# Patient Record
Sex: Male | Born: 1985 | Race: Black or African American | Hispanic: No | Marital: Single | Smoking: Current every day smoker
Health system: Southern US, Community
[De-identification: ages and names within clinical notes are randomized; demographics above are authoritative.]

## PROBLEM LIST (undated history)

## (undated) DIAGNOSIS — J939 Pneumothorax, unspecified: Secondary | ICD-10-CM

## (undated) HISTORY — PX: VIDEO ASSISTED THORACOSCOPY (VATS)/THOROCOTOMY: SHX6173

---
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
## 2008-01-14 ENCOUNTER — Emergency Department (HOSPITAL_COMMUNITY): Admission: EM | Admit: 2008-01-14 | Discharge: 2008-01-14 | Payer: Self-pay | Admitting: Emergency Medicine

## 2008-03-19 ENCOUNTER — Emergency Department (HOSPITAL_COMMUNITY): Admission: EM | Admit: 2008-03-19 | Discharge: 2008-03-19 | Payer: Self-pay | Admitting: Emergency Medicine

## 2008-12-12 ENCOUNTER — Inpatient Hospital Stay (HOSPITAL_COMMUNITY): Admission: EM | Admit: 2008-12-12 | Discharge: 2008-12-18 | Payer: Self-pay | Admitting: Thoracic Surgery

## 2008-12-12 ENCOUNTER — Ambulatory Visit: Payer: Self-pay | Admitting: Thoracic Surgery

## 2008-12-15 ENCOUNTER — Encounter: Payer: Self-pay | Admitting: Thoracic Surgery

## 2008-12-22 ENCOUNTER — Encounter: Admission: RE | Admit: 2008-12-22 | Discharge: 2008-12-22 | Payer: Self-pay | Admitting: Thoracic Surgery

## 2008-12-22 ENCOUNTER — Ambulatory Visit: Payer: Self-pay | Admitting: Thoracic Surgery

## 2009-01-11 ENCOUNTER — Ambulatory Visit: Payer: Self-pay | Admitting: Thoracic Surgery

## 2010-07-14 IMAGING — CR DG CHEST 1V PORT
1 series · 1 of 1 positions shown · non-contrast
Comparison: Earlier the same day

CLINICAL DATA: Pneumothorax.  Chest tube placement.

PORTABLE CHEST - 1 VIEW

[view not recorded]
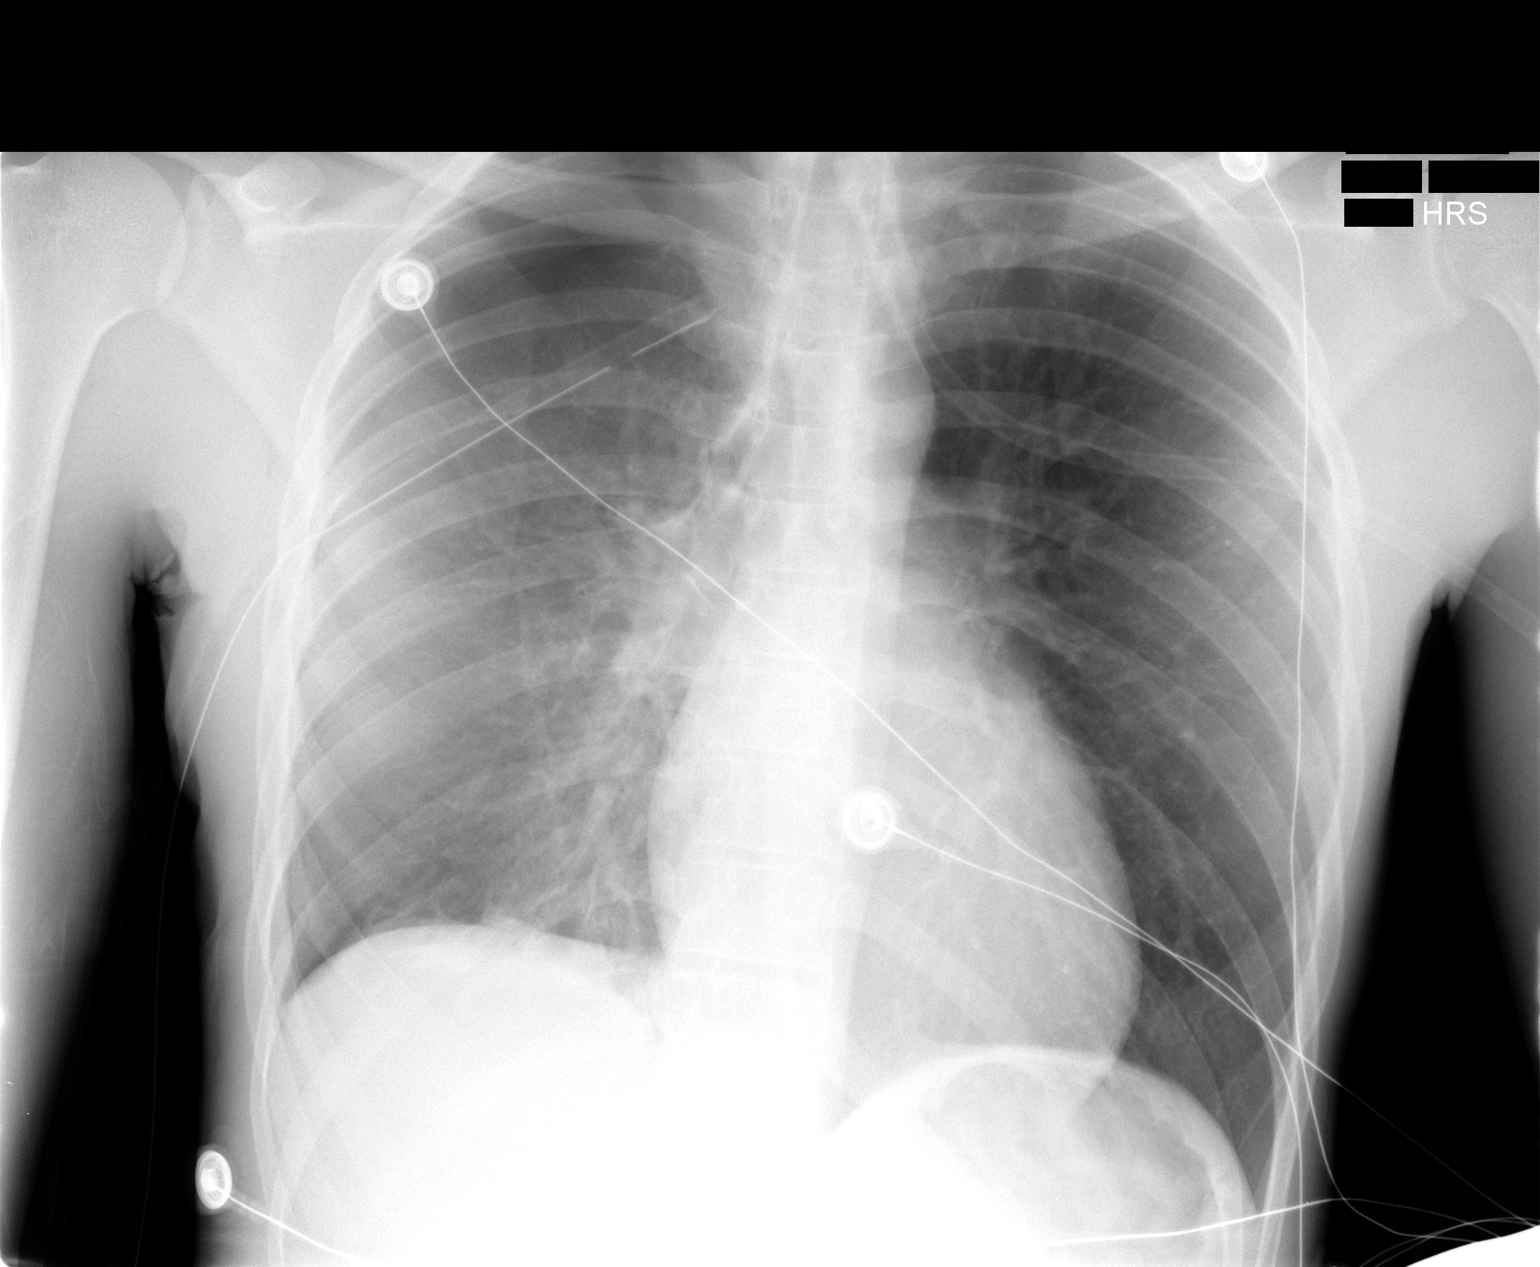

[1 of 1 positions shown; findings below may reference images not displayed]

FINDINGS: 7780 hours.  The patient is status post right chest tube
placement.  There has been interval improvement and right lung
expansion although apical right pneumothorax does persist.  Left
lung remains clear.  Heart size is within normal limits.
IMPRESSION: Interval placement of right chest tube with a substantial decrease
in the right pneumothorax.

## 2010-07-15 IMAGING — CT CT CHEST W/O CM
1 series · 15 of 31 positions shown, 19 images · non-contrast
Comparison: 12/13/2008

CLINICAL DATA: Right-sided pneumothorax.  Evaluate for bleb.

CT CHEST WITHOUT CONTRAST
TECHNIQUE: Multidetector CT imaging of the chest was performed
following the standard protocol without IV contrast.

[Series 2: routine chest 5.0 st · axial · 0.65mm/px · z∈[-196,+108]mm · 15 of 67 slices shown, 19 images]
[im 3/67  mediastinal]
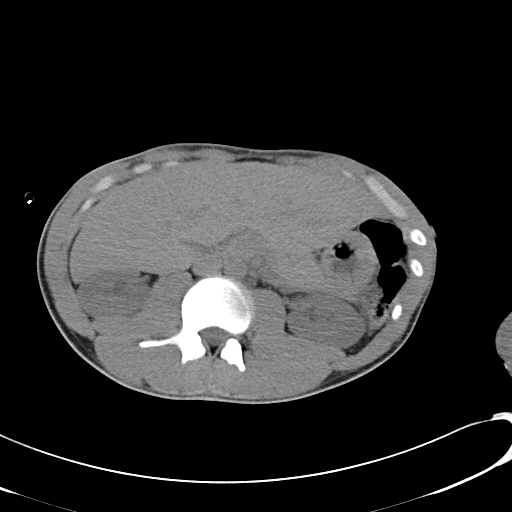
[im 3/67  lung]
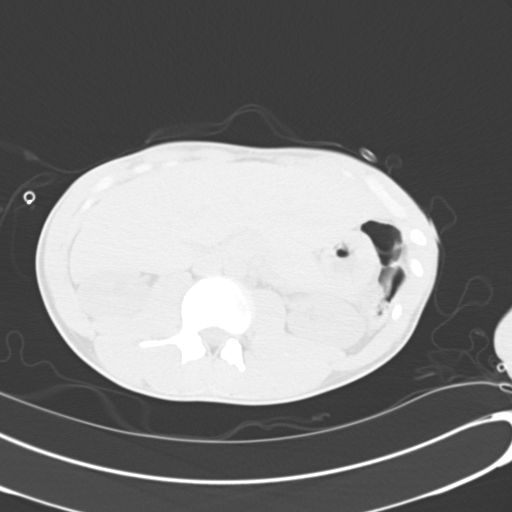
[im 8/67  lung]
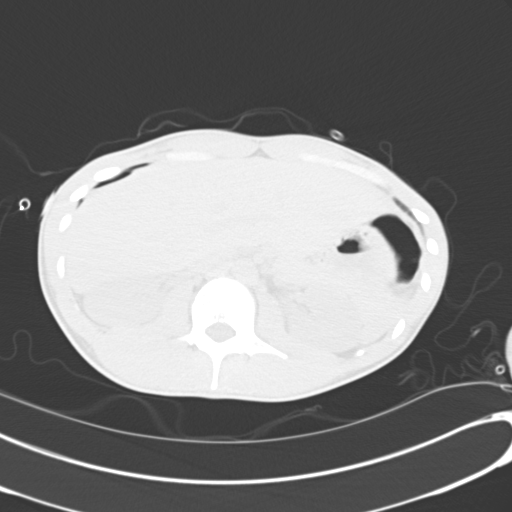
[im 13/67  lung]
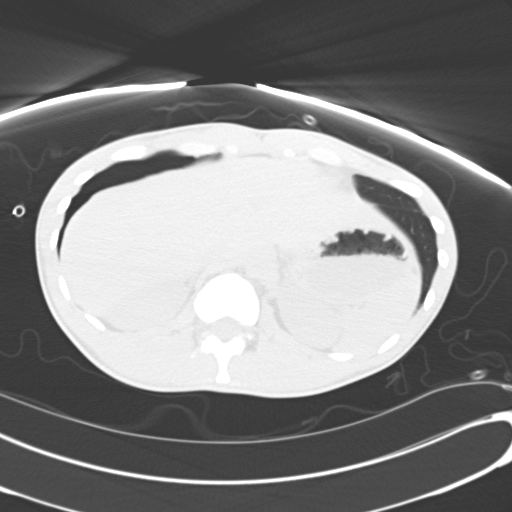
[im 15/67  lung]
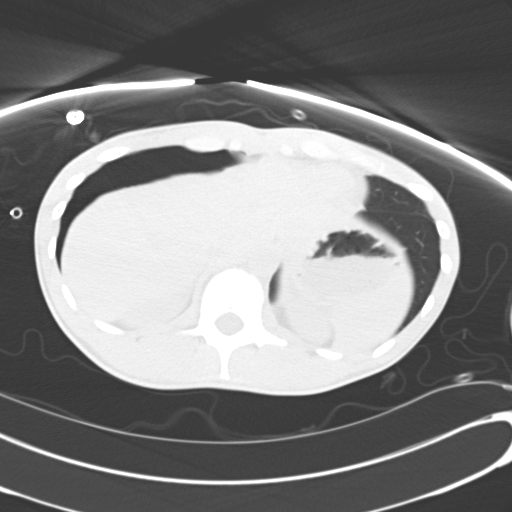
[im 20/67  mediastinal]
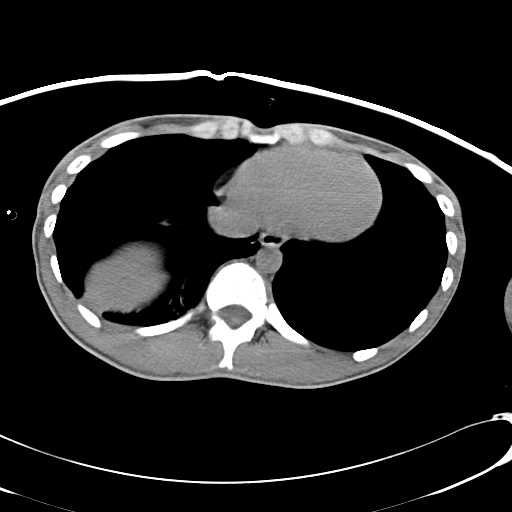
[im 20/67  lung]
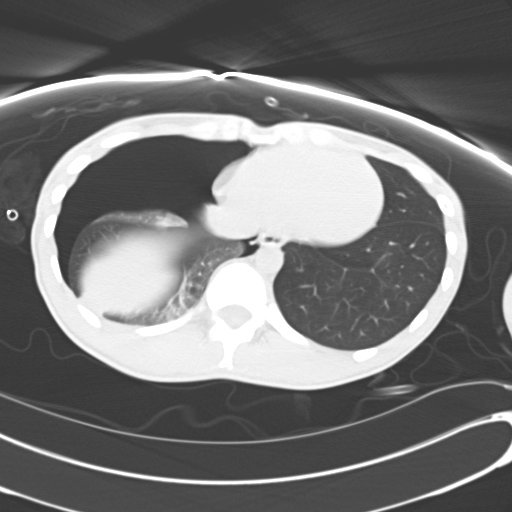
[im 25/67  lung]
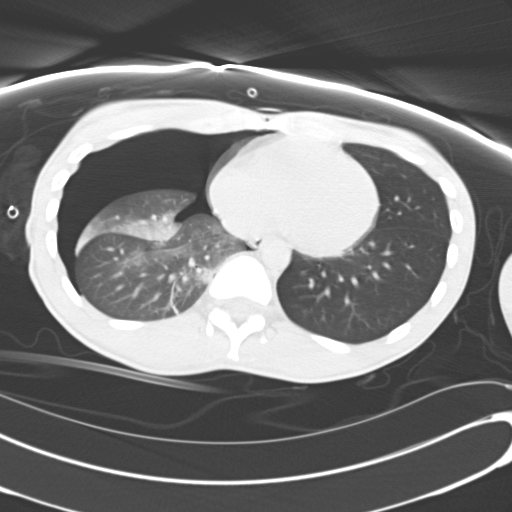
[im 30/67  lung]
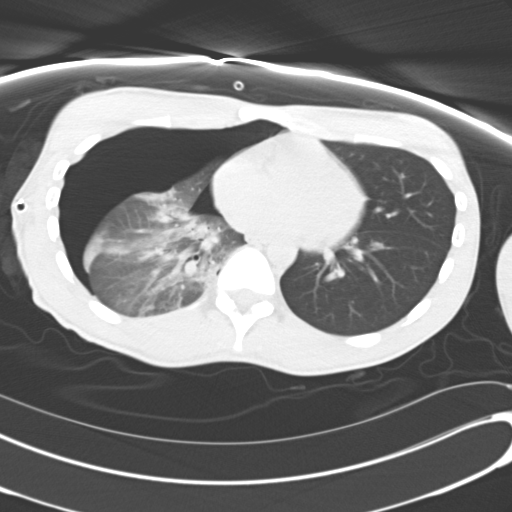
[im 35/67  lung]
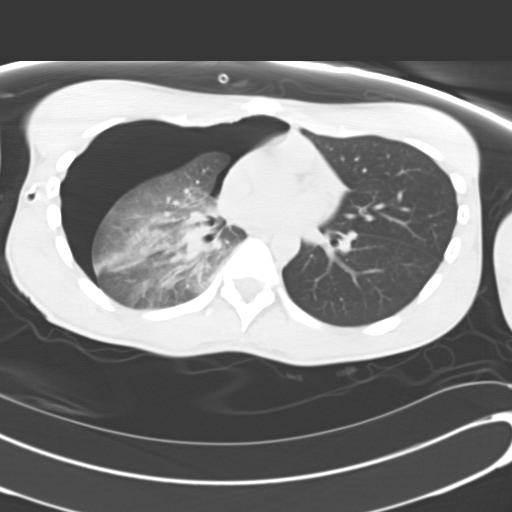
[im 37/67  mediastinal]
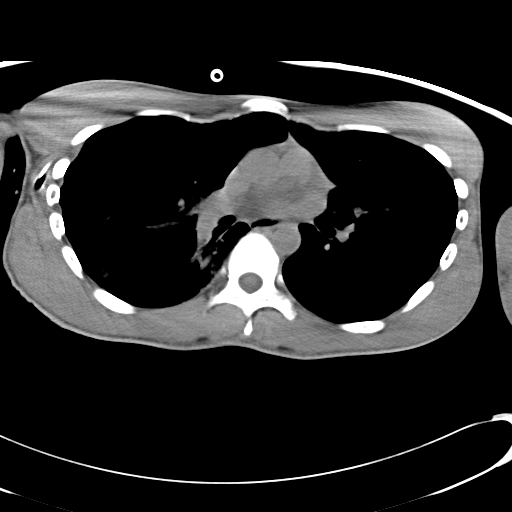
[im 37/67  lung]
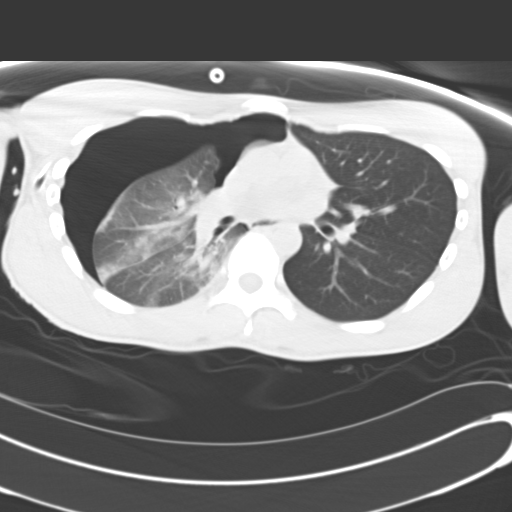
[im 42/67  lung]
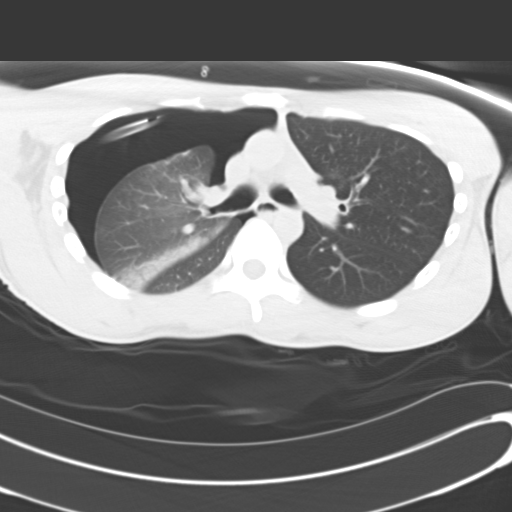
[im 47/67  lung]
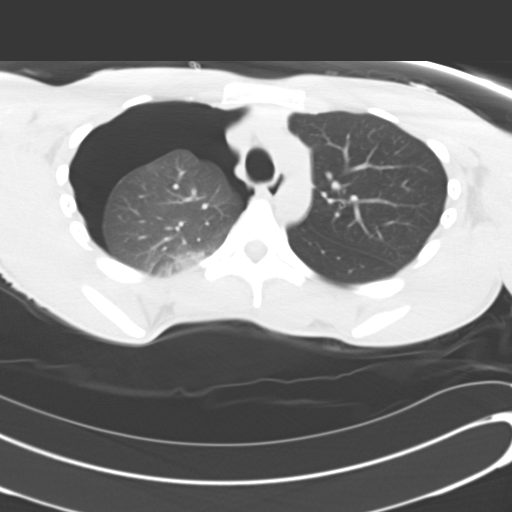
[im 52/67  lung]
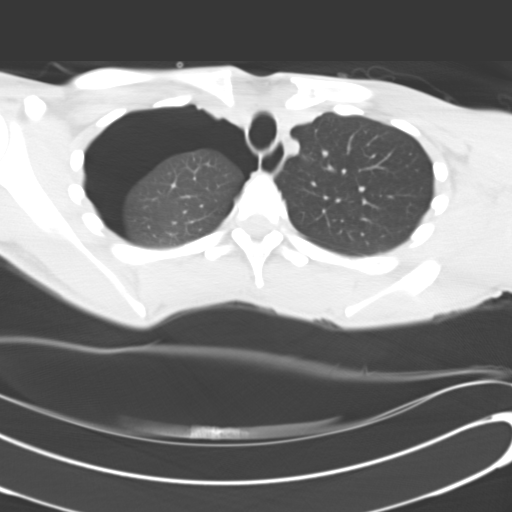
[im 54/67  mediastinal]
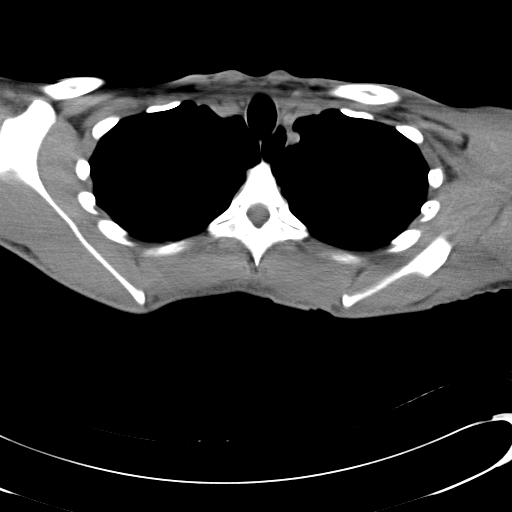
[im 54/67  lung]
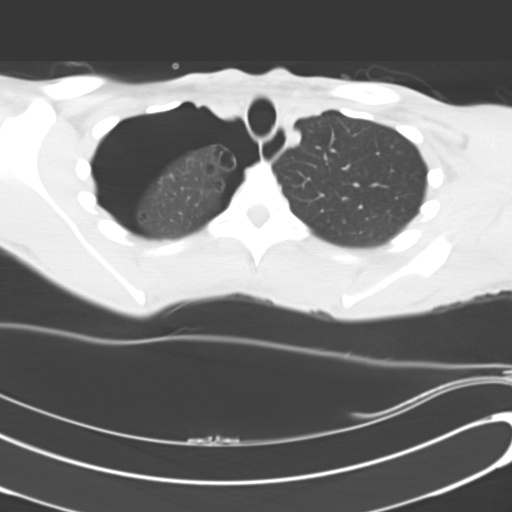
[im 59/67  lung]
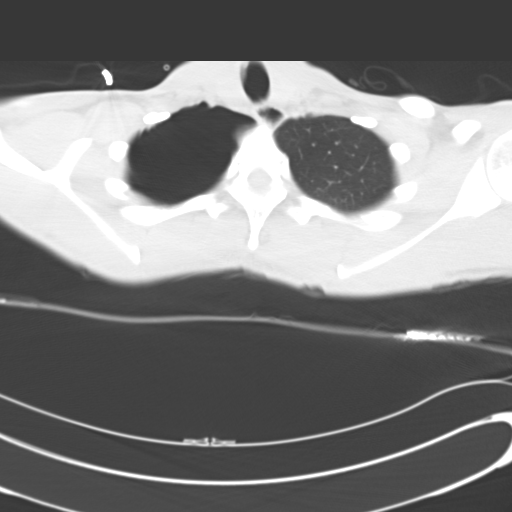
[im 64/67  lung]
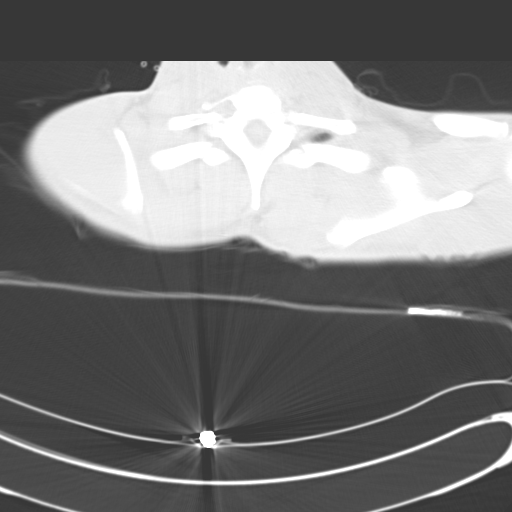

[15 of 31 positions shown; findings below may reference images not displayed]

FINDINGS: Lung windows demonstrate areas of pulmonary parenchymal
blebs at the apex.  This could also represent apical paraseptal
emphysema however the appearance is more consistent with leads,
with disorganized pulmonary parenchymal architecture.  These are
best seen on axial image number 13 and 14 in the medial right apex.
Smaller one is present in the lateral right apical posterior
segment.  The right-sided pneumothorax is large.  Atelectasis is
present in the partially collapsed lung.  Pneumothorax is estimated
at 40-50%.  The left lung appears within normal limits without
emphysema or blebs.  Trachea appears within normal limits.  Grossly
the heart is unremarkable, allowing for noncontrast technique.  No
lymphadenopathy is identified.  The right thoracostomy tube
terminates in the anterior right pleural space.  Incidental imaging
the upper abdomen is unremarkable.

There is also a small bleb in the lateral right middle lobe on
image number 35.  The upper thoracic esophagus is patulous.  Bones
appear within normal limits.
IMPRESSION: 1.  Large right pneumothorax with thoracostomy tube in place.
Multiple medial and lateral right upper lobe apical blebs.
2.  Small right middle lobe posterolateral bleb.
3.  No left-sided blebs or paraseptal emphysema identified.

## 2010-07-17 IMAGING — CR DG CHEST 1V PORT
1 series · 1 of 1 positions shown · non-contrast
Comparison: 12/14/2008

CLINICAL DATA: Right pneumothorax, chest tube, follow-up

PORTABLE CHEST - 1 VIEW

[view not recorded]
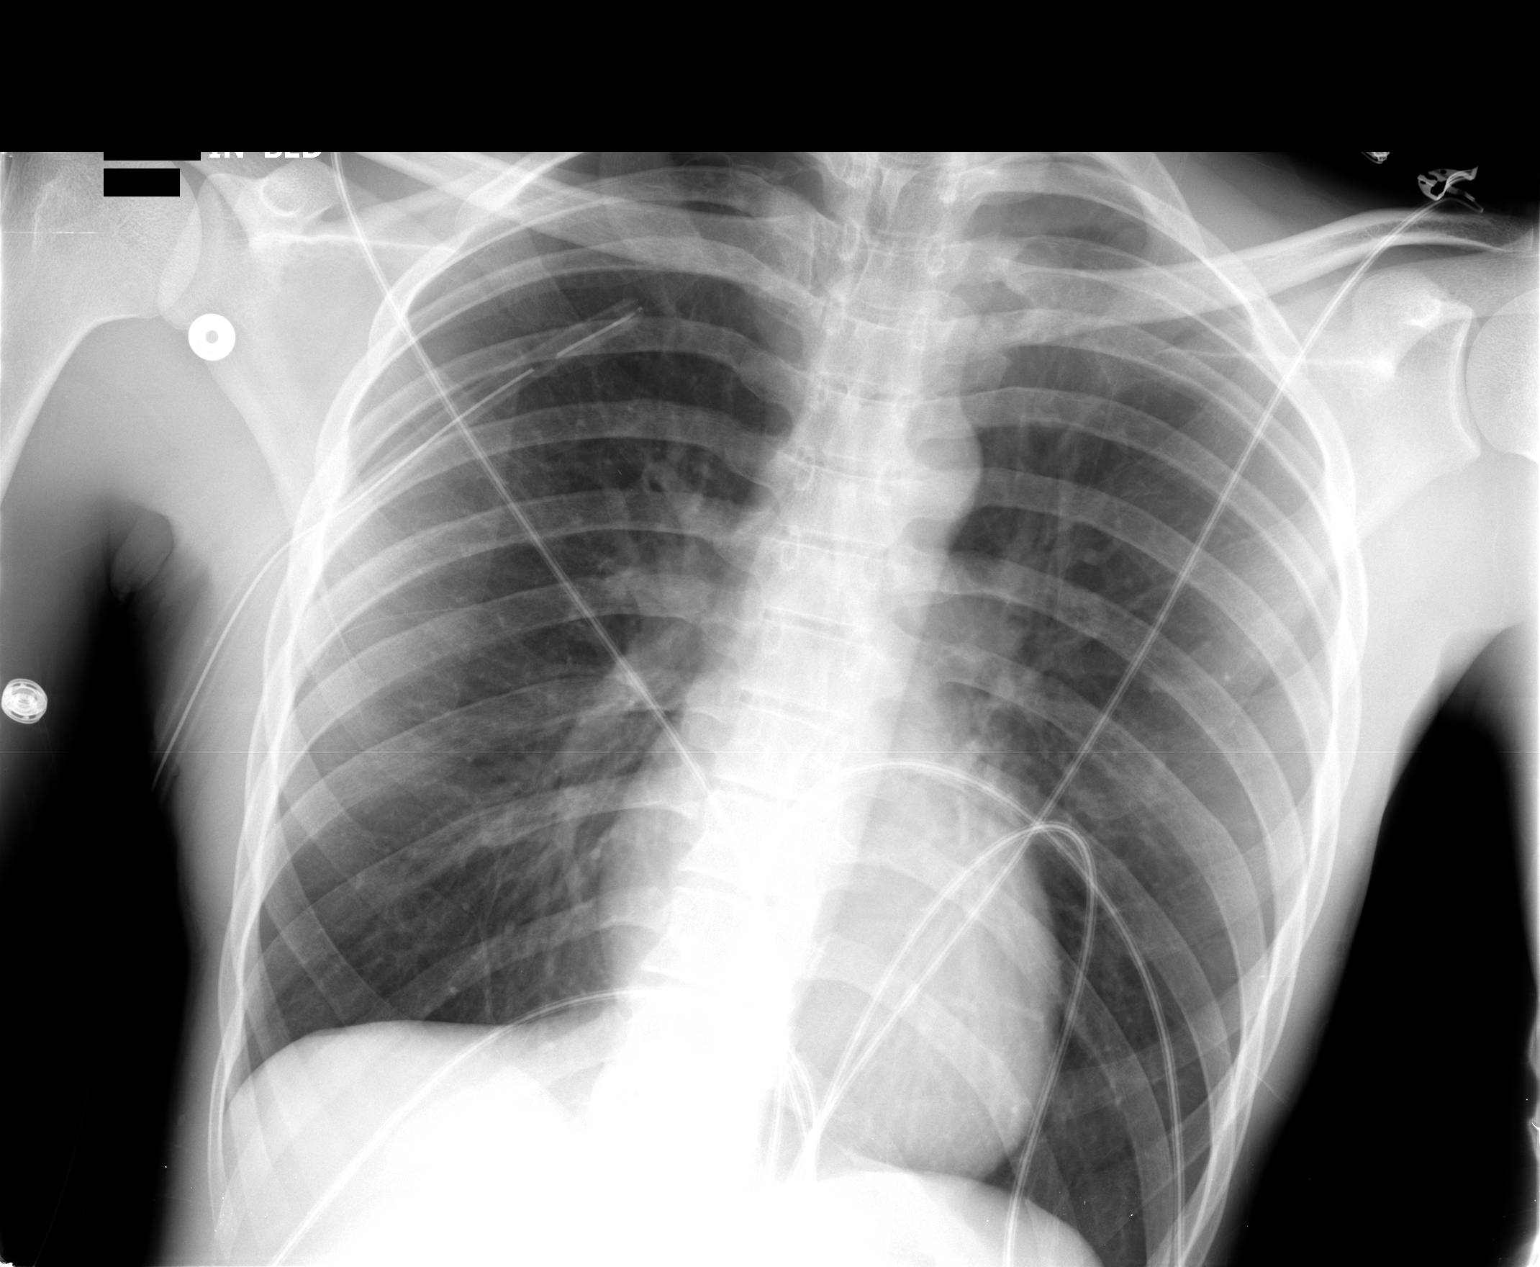

[1 of 1 positions shown; findings below may reference images not displayed]

FINDINGS: Stable small residual right apical pneumothorax.  Right
chest tube remains.  Lungs are otherwise clear.  Normal heart size
and vascularity.  Trachea is midline.
IMPRESSION: Stable right apical pneumothorax

## 2010-07-17 IMAGING — CR DG CHEST 1V PORT
1 series · 1 of 1 positions shown · non-contrast
Comparison: 12/15/2008 at  1461 hours

CLINICAL DATA: Central line placement and new chest tube placement.

PORTABLE CHEST - 1 VIEW

[view not recorded]
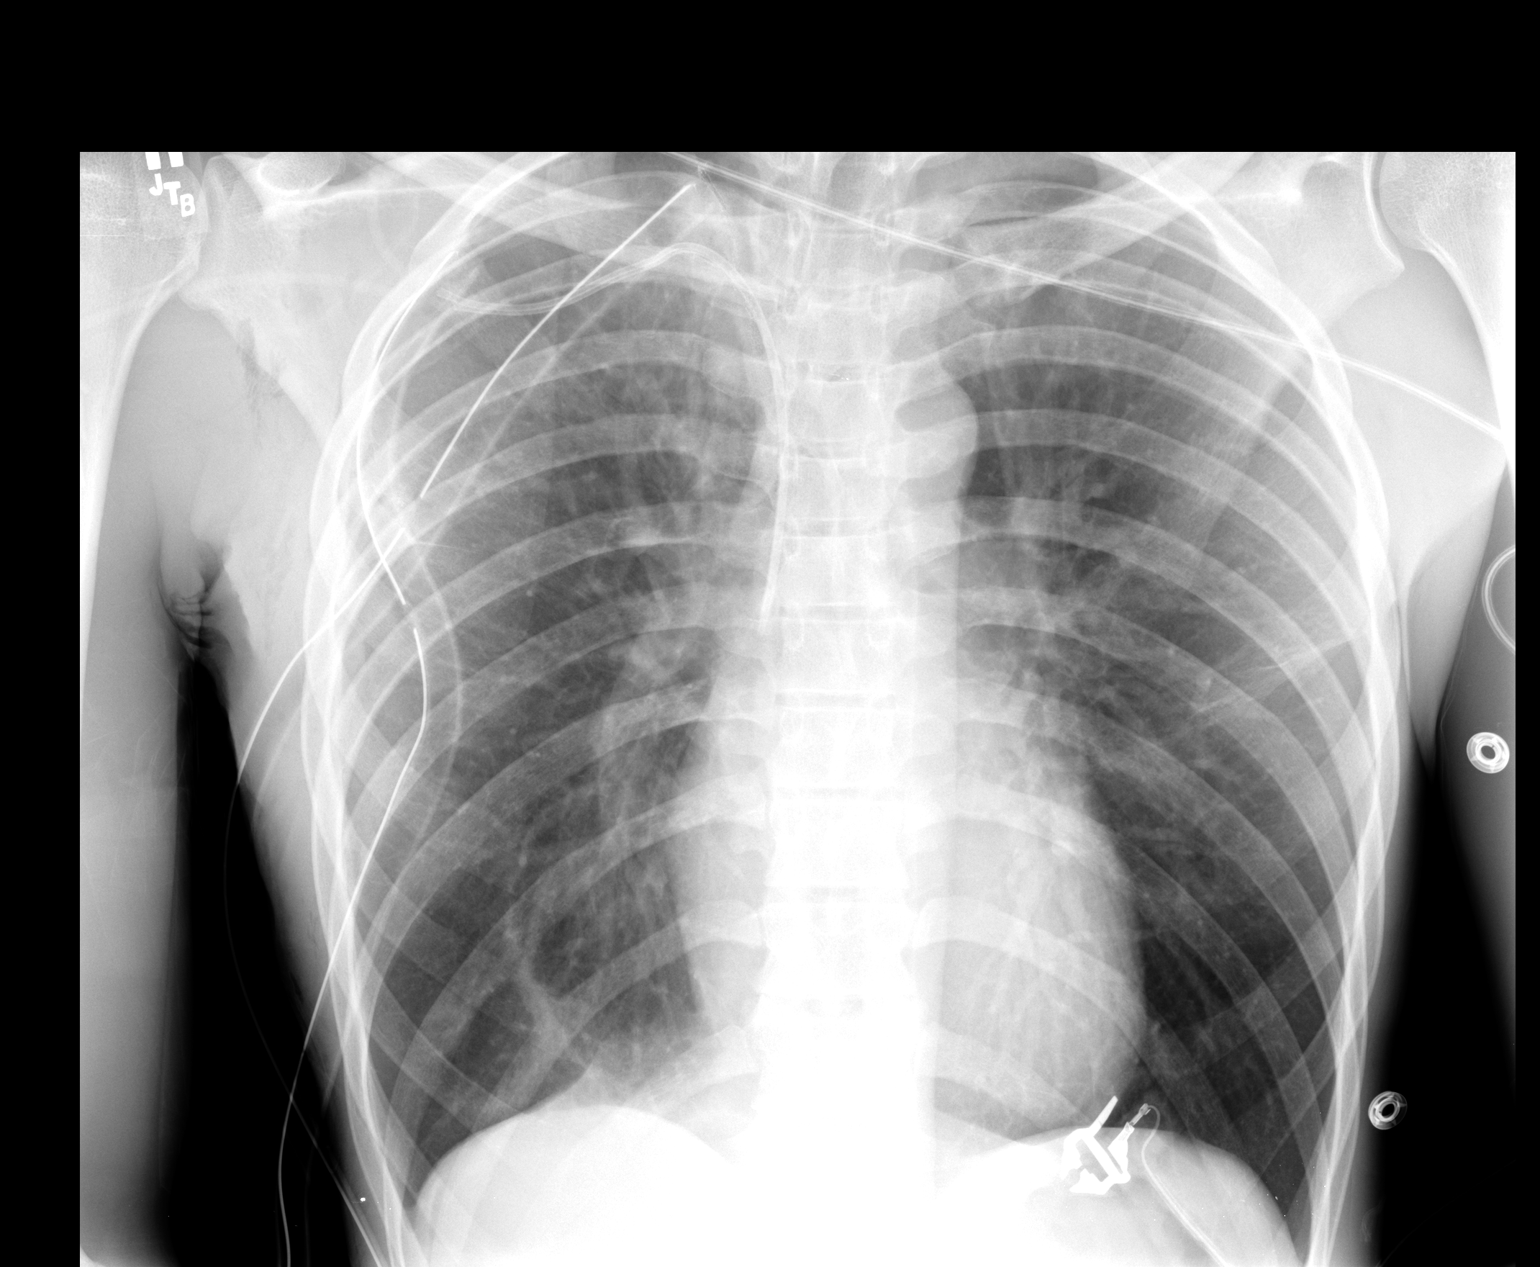

[1 of 1 positions shown; findings below may reference images not displayed]

FINDINGS: A new central line has been placed from the right
subclavian approach.  The tip is in the SVC, above the cavoatrial
junction.  A new right chest tube has been placed.  Right apical
pneumothorax is smaller, estimated at about 5%.  Lungs clear.
Heart and mediastinal contours normal.
IMPRESSION: 1.  New right subclavian central line placement to the SVC.
2.  A new right chest tube has been placed.
3.  Right apical pneumothorax decreased to about 5%.

## 2010-09-18 ENCOUNTER — Encounter: Payer: Self-pay | Admitting: Thoracic Surgery

## 2010-12-06 LAB — COMPREHENSIVE METABOLIC PANEL
ALT: 17 U/L (ref 0–53)
AST: 20 U/L (ref 0–37)
AST: 35 U/L (ref 0–37)
Albumin: 3.2 g/dL — ABNORMAL LOW (ref 3.5–5.2)
Alkaline Phosphatase: 57 U/L (ref 39–117)
CO2: 32 mEq/L (ref 19–32)
Chloride: 99 mEq/L (ref 96–112)
Creatinine, Ser: 0.86 mg/dL (ref 0.4–1.5)
GFR calc Af Amer: >60 mL/min (ref >60)
GFR calc Af Amer: >60 mL/min (ref >60)
Glucose, Bld: 99 mg/dL (ref 70–99)
Potassium: 3.2 mEq/L — ABNORMAL LOW (ref 3.5–5.1)
Potassium: 4.6 mEq/L (ref 3.5–5.1)
Sodium: 140 mEq/L (ref 135–145)
Total Bilirubin: 1.2 mg/dL (ref 0.3–1.2)
Total Protein: 6.8 g/dL (ref 6.0–8.3)

## 2010-12-06 LAB — BLOOD GAS, ARTERIAL
Acid-Base Excess: 4.7 mmol/L — ABNORMAL HIGH (ref 0.0–2.0)
Bicarbonate: 29 mEq/L — ABNORMAL HIGH (ref 20.0–24.0)
O2 Content: 2 L/min
O2 Saturation: 95.9 %
Patient temperature: 98.6
TCO2: 30.4 mmol/L (ref 0–100)
pO2, Arterial: 75.6 mmHg — ABNORMAL LOW (ref 80.0–100.0)

## 2010-12-06 LAB — CBC
HCT: 39.1 % (ref 39.0–52.0)
Hemoglobin: 13.4 g/dL (ref 13.0–17.0)
Hemoglobin: 14.8 g/dL (ref 13.0–17.0)
MCHC: 34.1 g/dL (ref 30.0–36.0)
MCV: 91.7 fL (ref 78.0–100.0)
Platelets: 169 10*3/uL (ref 150–400)
RBC: 4.27 MIL/uL (ref 4.22–5.81)
RBC: 4.82 MIL/uL (ref 4.22–5.81)
RDW: 12.9 % (ref 11.5–15.5)
WBC: 5.9 10*3/uL (ref 4.0–10.5)
WBC: 6.4 10*3/uL (ref 4.0–10.5)

## 2010-12-06 LAB — HEPARIN LEVEL (UNFRACTIONATED): Heparin Unfractionated: <0.1 IU/mL — ABNORMAL LOW (ref 0.30–0.70)

## 2010-12-06 LAB — BASIC METABOLIC PANEL
CO2: 29 mEq/L (ref 19–32)
Chloride: 98 mEq/L (ref 96–112)
Creatinine, Ser: 0.91 mg/dL (ref 0.4–1.5)
GFR calc Af Amer: >60 mL/min (ref >60)
Potassium: 3.6 mEq/L (ref 3.5–5.1)

## 2010-12-06 LAB — APTT: aPTT: 35 seconds (ref 24–37)
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
## 2011-01-09 NOTE — Op Note (Signed)
Christopher [name], Christopher [name]             ACCOUNT NO.:  [id number]   MEDICAL RECORD NO.:  [id number]          PATIENT TYPE:  EMS   LOCATION:  ED                           FACILITY:  [facility]   PHYSICIAN:  [name], M.D. DATE OF BIRTH:  08-31-1985   DATE OF PROCEDURE:  DATE OF DISCHARGE:                               OPERATIVE REPORT   This was done at [facility].  The patient is being transferred  to [facility].   PREOPERATIVE DIAGNOSIS:  Right spontaneous pneumothorax.   POSTOPERATIVE DIAGNOSIS:  Right spontaneous pneumothorax.   OPERATION PERFORMED:  Insertion of right chest tube.   After local anesthesia with Xylocaine and prepping and draping the right  chest, the patient was given 5 mg of Versed.  A 1-cm incision was made  over the fifth intercostal space in the mid axillary line and 2 chest  tube sutures were placed and dissection was carried down through the  subcutaneous tissue and the right chest was entered and air was  evacuated and a #20 chest tube was inserted and sutured in place with 0  silk and connected to the Pleur-evac.  Dry sterile dressing applied.  The patient tolerated the procedure well.      [name], M.D.  Electronically Signed     DPB/MEDQ  D:  12/12/2008  T:  12/13/2008  Job:  [id number]

## 2011-01-09 NOTE — Assessment & Plan Note (Signed)
OFFICE VISIT   [name], [name]  DOB:  08/08/1986                                        Jan 11, 2009  CHART #:  [id number]   The patient's blood pressure was 128/74, pulse of 100, respirations 18,  sats were 98%.  Chest x-ray was not done.  Lungs are clear to  auscultation and percussion.  His incisions were well healed.  He says  no dyspnea.  He is doing well.  We will release him to full activity.  We will see him back again if he develops any further pulmonary  problems.   [name], M.D.  Electronically Signed   DPB/MEDQ  D:  01/11/2009  T:  01/12/2009  Job:  [id number]

## 2011-01-09 NOTE — Assessment & Plan Note (Signed)
OFFICE VISIT   [name], Christopher  DOB:  [birth year]                                        December 22, 2008  CHART #:  [id number]   The patient came for followup today after his pneumothorax and his  incisions were well healed.  His sutures were removed.  His blood  pressure was 117/70, pulse 78, respirations 18, and saturations 100%.  His chest x-ray showed no evidence of recurrence of his recurrence of  his pneumothorax secondary to apical blebs.  We will release him to full  activity, then I will see him back again in 3 weeks with a chest x-ray.   [name], M.D.  Electronically Signed   DPB/MEDQ  D:  12/22/2008  T:  12/23/2008  Job:  [id number]

## 2011-05-23 LAB — URINE MICROSCOPIC-ADD ON

## 2011-05-23 LAB — URINALYSIS, ROUTINE W REFLEX MICROSCOPIC
Bilirubin Urine: NEGATIVE
Hgb urine dipstick: NEGATIVE
Specific Gravity, Urine: 1.004 — ABNORMAL LOW
Urobilinogen, UA: 1

## 2011-05-23 LAB — GC/CHLAMYDIA PROBE AMP, GENITAL
Chlamydia, DNA Probe: NEGATIVE
GC Probe Amp, Genital: NEGATIVE

## 2013-09-29 ENCOUNTER — Emergency Department (HOSPITAL_COMMUNITY): Payer: Self-pay

## 2013-09-29 ENCOUNTER — Emergency Department (HOSPITAL_COMMUNITY)
Admission: EM | Admit: 2013-09-29 | Discharge: 2013-09-29 | Disposition: A | Discharge status: Home / Self Care | Payer: Self-pay | Attending: Emergency Medicine | Admitting: Emergency Medicine

## 2013-09-29 ENCOUNTER — Encounter (HOSPITAL_COMMUNITY): Payer: Self-pay | Admitting: Emergency Medicine

## 2013-09-29 DIAGNOSIS — S6390XA Sprain of unspecified part of unspecified wrist and hand, initial encounter: Secondary | ICD-10-CM | POA: Insufficient documentation

## 2013-09-29 DIAGNOSIS — W230XXA Caught, crushed, jammed, or pinched between moving objects, initial encounter: Secondary | ICD-10-CM | POA: Insufficient documentation

## 2013-09-29 DIAGNOSIS — F17200 Nicotine dependence, unspecified, uncomplicated: Secondary | ICD-10-CM | POA: Insufficient documentation

## 2013-09-29 DIAGNOSIS — S6720XA Crushing injury of unspecified hand, initial encounter: Secondary | ICD-10-CM | POA: Insufficient documentation

## 2013-09-29 DIAGNOSIS — Z8709 Personal history of other diseases of the respiratory system: Secondary | ICD-10-CM | POA: Insufficient documentation

## 2013-09-29 DIAGNOSIS — Y9389 Activity, other specified: Secondary | ICD-10-CM | POA: Insufficient documentation

## 2013-09-29 DIAGNOSIS — S6391XA Sprain of unspecified part of right wrist and hand, initial encounter: Secondary | ICD-10-CM

## 2013-09-29 DIAGNOSIS — Y929 Unspecified place or not applicable: Secondary | ICD-10-CM | POA: Insufficient documentation

## 2013-09-29 HISTORY — DX: Pneumothorax, unspecified: J93.9

## 2013-09-29 MED ORDER — IBUPROFEN 400 MG PO TABS
800.0000 mg | ORAL_TABLET | Freq: Once | ORAL | Status: AC
  Administered 2013-09-29: 800 mg via ORAL
  Filled 2013-09-29: qty 2
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
## 2013-09-29 NOTE — Discharge Instructions (Signed)
Your x-rays do not show any broken bones or other concerning injuries in your hand. Please followup with orthopedic specialist for continued evaluation and treatment. Use rest, ice, compression and elevation to reduce pain and swelling in your hand.    Crush Injury, Fingers or Toes A crush injury to the fingers or toes means the tissues have been damaged by being squeezed (compressed). There will be bleeding into the tissues and swelling. Often, blood will collect under the skin. When this happens, the skin on the finger often dies and may slough off (shed) 1 week to 10 days later. Usually, new skin is growing underneath. If the injury has been too severe and the tissue does not survive, the damaged tissue may begin to turn black over several days.  Wounds which occur because of the crushing may be stitched (sutured) shut. However, crush injuries are more likely to become infected than other injuries.These wounds may not be closed as tightly as other types of cuts to prevent infection. Nails involved are often lost. These usually grow back over several weeks.  DIAGNOSIS X-rays may be taken to see if there is any injury to the bones. TREATMENT Broken bones (fractures) may be treated with splinting, depending on the fracture. Often, no treatment is required for fractures of the last bone in the fingers or toes. HOME CARE INSTRUCTIONS   The crushed part should be raised (elevated) above the heart or center of the chest as much as possible for the first several days or as directed. This helps with pain and lessens swelling. Less swelling increases the chances that the crushed part will survive.  Put ice on the injured area.  Put ice in a plastic bag.  Place a towel between your skin and the bag.  Leave the ice on for 15-20 minutes, 03-04 times a day for the first 2 days.  Only take over-the-counter or prescription medicines for pain, discomfort, or fever as directed by your caregiver.  Use your  injured part only as directed.  Change your bandages (dressings) as directed.  Keep all follow-up appointments as directed by your caregiver. Not keeping your appointment could result in a chronic or permanent injury, pain, and disability. If there is any problem keeping the appointment, you must call to reschedule. SEEK IMMEDIATE MEDICAL CARE IF:   There is redness, swelling, or increasing pain in the wound area.  Pus is coming from the wound.  You have a fever.  You notice a bad smell coming from the wound or dressing.  The edges of the wound do not stay together after the sutures have been removed.  You are unable to move the injured finger or toe. MAKE SURE YOU:   Understand these instructions.  Will watch your condition.  Will get help right away if you are not doing well or get worse. Document Released: 08/13/2005 Document Revised: 11/05/2011 Document Reviewed: 12/29/2010 [facility] Patient Information 2014 [employer], [place].

## 2013-09-29 NOTE — ED Notes (Addendum)
Ice applied. Pt transported to radiology

## 2013-09-29 NOTE — ED Provider Notes (Signed)
Medical screening examination/treatment/procedure(s) were performed by non-physician practitioner and as supervising physician I was immediately available for consultation/collaboration.  EKG Interpretation   None        Ethelda ChickMartha K Linker, MD 09/29/13 [phone]

## 2013-09-29 NOTE — ED Provider Notes (Signed)
CSN: [id number]     Arrival date & time 09/29/13  2153 History   First MD Initiated Contact with Patient 09/29/13 2307     Chief Complaint  Patient presents with  . Hand Pain   HPI  History provided by the patient. Patient is a 28 year old male who presents with injury to his right hand. Patient reports smashing his right hand in the car door earlier today. Since that time his had worsening pain and swelling to the hand. He reports increased pain with any movement of the fingers or in the hand. He has not used any specific treatment for symptoms. Does report some call use this evening. Denies any other injuries. Denies any weakness or numbness to the fingers. There is no bleeding or injury to the skin.   Past Medical History  Diagnosis Date  . Pneumothorax    History reviewed. No pertinent past surgical history. History reviewed. No pertinent family history. History  Substance Use Topics  . Smoking status: Current Every Day Smoker    Types: Cigarettes  . Smokeless tobacco: Not on file  . Alcohol Use: Yes    Review of Systems  Neurological: Negative for weakness and numbness.  All other systems reviewed and are negative.    Allergies  Review of patient's allergies indicates no known allergies.  Home Medications  No current outpatient prescriptions on file. BP 119/70  Pulse 88  Temp(Src) 97.6 F (36.4 C) (Oral)  Resp 14  SpO2 99% Physical Exam  Nursing note and vitals reviewed. Constitutional: He is oriented to person, place, and time. He appears well-developed and well-nourished. No distress.  HENT:  Head: Normocephalic.  Cardiovascular: Normal rate and regular rhythm.   Pulmonary/Chest: Effort normal and breath sounds normal. No respiratory distress.  Musculoskeletal: He exhibits edema and tenderness.  Decreased range of motion of the right hand and fingers secondary to pain and swelling. There is diffuse swelling over the dorsal aspect of the right hand greatest over  the third through fifth metatarsals. No gross deformities. Normal distal sensations to light touch in all fingers. Normal capillary refill less than 2 seconds.  Neurological: He is alert and oriented to person, place, and time.  Skin: Skin is warm.  Psychiatric: He has a normal mood and affect. His behavior is normal.    ED Course  Procedures    COORDINATION OF CARE:  Nursing notes reviewed. Vital signs reviewed. Initial pt interview and examination performed.   11:12 PM-patient seen and evaluated. He appears well no acute distress. X-rays reviewed with patient. No signs of fracture or other dislocation. Patient has localized swelling may have some his sprain to the hand. Denies punching with his hand. We'll plan to put patient with Ace wrap and give orthopedic in followup  Treatment plan initiated: Medications  ibuprofen (ADVIL,MOTRIN) tablet 800 mg (not administered)    Imaging Review Dg Hand Complete Right  09/29/2013   CLINICAL DATA:  Crush injury right hand.  EXAM: RIGHT HAND - COMPLETE 3+ VIEW  COMPARISON:  None.  FINDINGS: Imaged bones, joints and soft tissues appear normal.  IMPRESSION: Negative exam.   Electronically Signed   By: Drusilla Kannerhomas  Dalessio M.D.   On: 09/29/2013 22:27      MDM   1. Crush injury to hand   2. Sprain of hand, right        Angus Sellereter S Elizeth Weinrich, PA-C 09/29/13 2317

## 2013-09-29 NOTE — ED Notes (Signed)
Pt returned from radiology.

## 2013-10-17 ENCOUNTER — Emergency Department (HOSPITAL_COMMUNITY)
Admission: EM | Admit: 2013-10-17 | Discharge: 2013-10-17 | Disposition: A | Discharge status: Home / Self Care | Payer: Self-pay | Attending: Emergency Medicine | Admitting: Emergency Medicine

## 2013-10-17 ENCOUNTER — Encounter (HOSPITAL_COMMUNITY): Payer: Self-pay | Admitting: Emergency Medicine

## 2013-10-17 DIAGNOSIS — R110 Nausea: Secondary | ICD-10-CM

## 2013-10-17 DIAGNOSIS — R109 Unspecified abdominal pain: Secondary | ICD-10-CM

## 2013-10-17 DIAGNOSIS — R112 Nausea with vomiting, unspecified: Secondary | ICD-10-CM | POA: Insufficient documentation

## 2013-10-17 DIAGNOSIS — Z8709 Personal history of other diseases of the respiratory system: Secondary | ICD-10-CM | POA: Insufficient documentation

## 2013-10-17 DIAGNOSIS — R197 Diarrhea, unspecified: Secondary | ICD-10-CM | POA: Insufficient documentation

## 2013-10-17 DIAGNOSIS — R1013 Epigastric pain: Secondary | ICD-10-CM | POA: Insufficient documentation

## 2013-10-17 DIAGNOSIS — Z8711 Personal history of peptic ulcer disease: Secondary | ICD-10-CM | POA: Insufficient documentation

## 2013-10-17 DIAGNOSIS — F17200 Nicotine dependence, unspecified, uncomplicated: Secondary | ICD-10-CM | POA: Insufficient documentation

## 2013-10-17 LAB — CBC WITH DIFFERENTIAL/PLATELET
Basophils Absolute: 0 10*3/uL (ref 0.0–0.1)
Basophils Relative: 0 % (ref 0–1)
EOS ABS: 0 10*3/uL (ref 0.0–0.7)
Eosinophils Relative: 1 % (ref 0–5)
HCT: 43.6 % (ref 39.0–52.0)
HEMOGLOBIN: 15.5 g/dL (ref 13.0–17.0)
LYMPHS ABS: 1.4 10*3/uL (ref 0.7–4.0)
LYMPHS PCT: 25 % (ref 12–46)
MCH: 31.4 pg (ref 26.0–34.0)
MCHC: 35.6 g/dL (ref 30.0–36.0)
MCV: 88.3 fL (ref 78.0–100.0)
MONOS PCT: 9 % (ref 3–12)
Monocytes Absolute: 0.5 10*3/uL (ref 0.1–1.0)
Neutro Abs: 3.7 10*3/uL (ref 1.7–7.7)
Neutrophils Relative %: 65 % (ref 43–77)
PLATELETS: 236 10*3/uL (ref 150–400)
RBC: 4.94 MIL/uL (ref 4.22–5.81)
RDW: 11.9 % (ref 11.5–15.5)
WBC: 5.6 10*3/uL (ref 4.0–10.5)

## 2013-10-17 LAB — COMPREHENSIVE METABOLIC PANEL
ALBUMIN: 4.1 g/dL (ref 3.5–5.2)
ALT: 12 U/L (ref 0–53)
AST: 20 U/L (ref 0–37)
Alkaline Phosphatase: 68 U/L (ref 39–117)
BUN: 8 mg/dL (ref 6–23)
CO2: 27 mEq/L (ref 19–32)
CREATININE: 1.05 mg/dL (ref 0.50–1.35)
Calcium: 9.6 mg/dL (ref 8.4–10.5)
Chloride: 98 mEq/L (ref 96–112)
GFR calc Af Amer: >90 mL/min (ref >90)
GFR calc non Af Amer: >90 mL/min (ref >90)
Glucose, Bld: 130 mg/dL — ABNORMAL HIGH (ref 70–99)
Potassium: 4 mEq/L (ref 3.7–5.3)
Sodium: 138 mEq/L (ref 137–147)
Total Bilirubin: 0.9 mg/dL (ref 0.3–1.2)
Total Protein: 8.2 g/dL (ref 6.0–8.3)

## 2013-10-17 LAB — LIPASE, BLOOD: LIPASE: 29 U/L (ref 11–59)

## 2013-10-17 LAB — URINALYSIS, ROUTINE W REFLEX MICROSCOPIC
Bilirubin Urine: NEGATIVE
GLUCOSE, UA: NEGATIVE mg/dL
KETONES UR: NEGATIVE mg/dL
Leukocytes, UA: NEGATIVE
Nitrite: NEGATIVE
PH: 6 (ref 5.0–8.0)
PROTEIN: NEGATIVE mg/dL
Specific Gravity, Urine: 1.009 (ref 1.005–1.030)
Urobilinogen, UA: 0.2 mg/dL (ref 0.0–1.0)

## 2013-10-17 LAB — URINE MICROSCOPIC-ADD ON

## 2013-10-17 MED ORDER — ONDANSETRON 4 MG PO TBDP: 4.0000 mg | ORAL_TABLET | Freq: Three times a day (TID) | ORAL | Status: DC | PRN

## 2013-10-17 MED ORDER — ONDANSETRON 4 MG PO TBDP
4.0000 mg | ORAL_TABLET | Freq: Once | ORAL | Status: AC
  Administered 2013-10-17: 4 mg via ORAL
  Filled 2013-10-17: qty 1

## 2013-10-17 MED ORDER — FAMOTIDINE 20 MG PO TABS
20.0000 mg | ORAL_TABLET | Freq: Once | ORAL | Status: AC
  Administered 2013-10-17: 20 mg via ORAL
  Filled 2013-10-17: qty 1

## 2013-10-17 NOTE — ED Provider Notes (Signed)
CSN: [id number]     Arrival date & time 10/17/13  [id number] History   First MD Initiated Contact with Patient 10/17/13 [id number]     Chief Complaint  Patient presents with  . Abdominal Pain     (Consider location/radiation/quality/duration/timing/severity/associated sxs/prior Treatment) Patient is a 28 y.o. male presenting with abdominal pain. The history is provided by the patient and medical records.  Abdominal Pain Associated symptoms: diarrhea, nausea and vomiting    There is a 28 year old male with past medical history significant for gastric ulcers, presenting to the ED for abdominal pain, nausea, vomiting, and diarrhea.  Pt states vomiting and diarrhea resolved 2 days ago but pain and nausea persists.  Pain described as intermittent, sharp and stabbing with radiation to the mid sternal region. Patient denies any chest pain, shortness of breath, or palpitations. Patient states he has been able to eat and drink, but states he feels he has to "force it down."  Patient has a history of gastric ulcers, has recently been eating lots of fried food, spicy foods including chicken wings, and hot sauce.  He has not taken any kind of antacids thus far.  No prior hx of gastric ulcer perforation.  Pt is not currently followed by GI.  Pt has taken gas-x PTA without noted improvement.  VS stable on arrival.  Past Medical History  Diagnosis Date  . Pneumothorax   . Ulcer     gastric ulcer   History reviewed. No pertinent past surgical history. History reviewed. No pertinent family history. History  Substance Use Topics  . Smoking status: Current Every Day Smoker    Types: Cigarettes  . Smokeless tobacco: Not on file  . Alcohol Use: Yes    Review of Systems  Gastrointestinal: Positive for nausea, vomiting, abdominal pain and diarrhea.  All other systems reviewed and are negative.   Allergies  Review of patient's allergies indicates no known allergies.  Home Medications   Current Outpatient Rx   Name  Route  Sig  Dispense  Refill  . acetaminophen (TYLENOL) 500 MG tablet   Oral   Take 1,000 mg by mouth every 6 (six) hours as needed for mild pain.         Marland Kitchen. OVER THE COUNTER MEDICATION   Oral   Take 2 tablets by mouth daily as needed (for constipation). [employer] Laxative         . SIMETHICONE PO   Oral   Take 2 tablets by mouth daily as needed (for gas).          BP 134/83  Pulse 92  Temp(Src) 97.6 F (36.4 C) (Oral)  Resp 18  SpO2 98%  Physical Exam  Nursing note and vitals reviewed. Constitutional: He is oriented to person, place, and time. He appears well-developed and well-nourished. No distress.  HENT:  Head: Normocephalic and atraumatic.  Mouth/Throat: Oropharynx is clear and moist.  Mucous membranes moist  Eyes: Conjunctivae and EOM are normal. Pupils are equal, round, and reactive to light.  Neck: Normal range of motion. Neck supple.  Cardiovascular: Normal rate, regular rhythm and normal heart sounds.   Pulmonary/Chest: Effort normal and breath sounds normal. No respiratory distress. He has no wheezes.  Abdominal: Soft. Bowel sounds are normal. There is tenderness in the epigastric area. There is no guarding and no CVA tenderness.  Abdomen soft, non-distended, mild TTP epigastrium  Musculoskeletal: Normal range of motion. He exhibits no edema.  Neurological: He is alert and oriented to person, place, and time.  Skin: Skin is warm and dry. He is not diaphoretic.  Psychiatric: He has a normal mood and affect.    ED Course  Procedures (including critical care time) Labs Review Labs Reviewed  COMPREHENSIVE METABOLIC PANEL - Abnormal; Notable for the following:    Glucose, Bld 130 (*)    All other components within normal limits  URINALYSIS, ROUTINE W REFLEX MICROSCOPIC - Abnormal; Notable for the following:    Hgb urine dipstick SMALL (*)    All other components within normal limits  LIPASE, BLOOD  CBC WITH DIFFERENTIAL  URINE  MICROSCOPIC-ADD ON   Imaging Review No results found.  EKG Interpretation   None       MDM   Final diagnoses:  Abdominal pain  Nausea   On initial evaluation patient is afebrile and overall nontoxic appearing. He is tolerating PO and does not appear dehydrated.  His abdominal exam with mild tenderness in the epigastrium without rebound or guarding. He endorses some radiation of pain to his mid sternal region but denies chest pain, shortness of breath, or palpitations.  Will obtain basic labs and reassess.  Labs reassuring.  Pt given pepcid and zofran with significant improvement.  He has tolerated PO solids and liquids.  Repeat abdominal exam is benign.  At this time i have low suspicion for acute/surgical abdomen.  Suspicion that sx Pt afebrile, non-toxic appearing, NAD, VS stable- ok for discharge.  Advised to start daily antacids and advised on diet moderation including limiting spicy and acidic foods.  FU with GI if sx persist.  Discussed plan with pt, he acknowledged understanding and agreed with plan of care.  Garlon Hatchet, PA-C 10/17/13 1343

## 2013-10-17 NOTE — Discharge Instructions (Signed)
Take the prescribed medication as directed.  Advise taking a daily antacid-- pepcid, prilosec, zantac, etc. To help reduce stomach acid. Follow-up with [facility] if symptoms persist or worsen. Return to the ED for new concerns.

## 2013-10-17 NOTE — ED Notes (Signed)
PT eating candy and chips and reports ABD pain.

## 2013-10-17 NOTE — ED Provider Notes (Signed)
Medical screening examination/treatment/procedure(s) were performed by non-physician practitioner and as supervising physician I was immediately available for consultation/collaboration.  EKG Interpretation   None        Courtney F Horton, MD 10/17/13 1928 

## 2013-10-17 NOTE — Progress Notes (Signed)
Met Patient at bed side.Role of CM explained.Patient reports Increased abdominal pain as reason for today's ED visit.Patient confirms he has no PCP or health Insurance. Education provided on importance of establishing a PCP.Resource sheet provided for patient on the [facility] and the Urgent care center.Patient provided his Consent for this CM to e-mail the [facility] to assist with patients PCP appointment.Patient provided with a [facility] discount card,and resources for the [address], and the [employer] generic savers medication program.Teach back method used to confirm patients understanding.

## 2013-10-17 NOTE — ED Notes (Addendum)
Pt c/o abd pain, v/d for past week. States 'it feels like my stomach keeps locking up." he took gas-x without relief

## 2013-11-25 ENCOUNTER — Ambulatory Visit: Payer: Self-pay | Attending: Internal Medicine

## 2013-12-02 ENCOUNTER — Encounter (HOSPITAL_COMMUNITY): Payer: Self-pay | Admitting: Emergency Medicine

## 2013-12-02 ENCOUNTER — Emergency Department (HOSPITAL_COMMUNITY)
Admission: EM | Admit: 2013-12-02 | Discharge: 2013-12-02 | Disposition: A | Discharge status: Home / Self Care | Payer: Self-pay | Attending: Emergency Medicine | Admitting: Emergency Medicine

## 2013-12-02 DIAGNOSIS — F17200 Nicotine dependence, unspecified, uncomplicated: Secondary | ICD-10-CM | POA: Insufficient documentation

## 2013-12-02 DIAGNOSIS — Z8709 Personal history of other diseases of the respiratory system: Secondary | ICD-10-CM | POA: Insufficient documentation

## 2013-12-02 DIAGNOSIS — S0993XA Unspecified injury of face, initial encounter: Secondary | ICD-10-CM | POA: Insufficient documentation

## 2013-12-02 DIAGNOSIS — M545 Low back pain, unspecified: Secondary | ICD-10-CM

## 2013-12-02 DIAGNOSIS — Z79899 Other long term (current) drug therapy: Secondary | ICD-10-CM | POA: Insufficient documentation

## 2013-12-02 DIAGNOSIS — Z8719 Personal history of other diseases of the digestive system: Secondary | ICD-10-CM | POA: Insufficient documentation

## 2013-12-02 DIAGNOSIS — S199XXA Unspecified injury of neck, initial encounter: Secondary | ICD-10-CM

## 2013-12-02 DIAGNOSIS — Y92410 Unspecified street and highway as the place of occurrence of the external cause: Secondary | ICD-10-CM | POA: Insufficient documentation

## 2013-12-02 DIAGNOSIS — S298XXA Other specified injuries of thorax, initial encounter: Secondary | ICD-10-CM | POA: Insufficient documentation

## 2013-12-02 DIAGNOSIS — IMO0002 Reserved for concepts with insufficient information to code with codable children: Secondary | ICD-10-CM | POA: Insufficient documentation

## 2013-12-02 DIAGNOSIS — Y9389 Activity, other specified: Secondary | ICD-10-CM | POA: Insufficient documentation

## 2013-12-02 MED ORDER — CYCLOBENZAPRINE HCL 10 MG PO TABS: 10.0000 mg | ORAL_TABLET | Freq: Two times a day (BID) | ORAL | Status: DC | PRN

## 2013-12-02 MED ORDER — TRAMADOL HCL 50 MG PO TABS: 50.0000 mg | ORAL_TABLET | Freq: Four times a day (QID) | ORAL | Status: DC | PRN
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
## 2013-12-02 NOTE — Discharge Instructions (Signed)
Back Pain, Adult °Low back pain is very common. About 1 in 5 people have back pain. The cause of low back pain is rarely dangerous. The pain often gets better over time. About half of people with a sudden onset of back pain feel better in just 2 weeks. About 8 in 10 people feel better by 6 weeks.  °CAUSES °Some common causes of back pain include: °· Strain of the muscles or ligaments supporting the spine. °· Wear and tear (degeneration) of the spinal discs. °· Arthritis. °· Direct injury to the back. °DIAGNOSIS °Most of the time, the direct cause of low back pain is not known. However, back pain can be treated effectively even when the exact cause of the pain is unknown. Answering your caregiver's questions about your overall health and symptoms is one of the most accurate ways to make sure the cause of your pain is not dangerous. If your caregiver needs more information, he or she may order lab work or imaging tests (X-rays or MRIs). However, even if imaging tests show changes in your back, this usually does not require surgery. °HOME CARE INSTRUCTIONS °For many people, back pain returns. Since low back pain is rarely dangerous, it is often a condition that people can learn to manage on their own.  °· Remain active. It is stressful on the back to sit or stand in one place. Do not sit, drive, or stand in one place for more than 30 minutes at a time. Take short walks on level surfaces as soon as pain allows. Try to increase the length of time you walk each day. °· Do not stay in bed. Resting more than 1 or 2 days can delay your recovery. °· Do not avoid exercise or work. Your body is made to move. It is not dangerous to be active, even though your back may hurt. Your back will likely heal faster if you return to being active before your pain is gone. °· Pay attention to your body when you  bend and lift. Many people have less discomfort when lifting if they bend their knees, keep the load close to their bodies, and  avoid twisting. Often, the most comfortable positions are those that put less stress on your recovering back. °· Find a comfortable position to sleep. Use a firm mattress and lie on your side with your knees slightly bent. If you lie on your back, put a pillow under your knees. °· Only take over-the-counter or prescription medicines as directed by your caregiver. Over-the-counter medicines to reduce pain and inflammation are often the most helpful. Your caregiver may prescribe muscle relaxant drugs. These medicines help dull your pain so you can more quickly return to your normal activities and healthy exercise. °· Put ice on the injured area. °· Put ice in a plastic bag. °· Place a towel between your skin and the bag. °· Leave the ice on for 15-20 minutes, 03-04 times a day for the first 2 to 3 days. After that, ice and heat may be alternated to reduce pain and spasms. °· Ask your caregiver about trying back exercises and gentle massage. This may be of some benefit. °· Avoid feeling anxious or stressed. Stress increases muscle tension and can worsen back pain. It is important to recognize when you are anxious or stressed and learn ways to manage it. Exercise is a great option. °SEEK MEDICAL CARE IF: °· You have pain that is not relieved with rest or medicine. °· You have pain that does not improve in 1 week. °· You have new symptoms. °· You are generally not feeling well. °SEEK   IMMEDIATE MEDICAL CARE IF:  °· You have pain that radiates from your back into your legs. °· You develop new bowel or bladder control problems. °· You have unusual weakness or numbness in your arms or legs. °· You develop nausea or vomiting. °· You develop abdominal pain. °· You feel faint. °Document Released: 08/13/2005 Document Revised: 02/12/2012 Document Reviewed: 01/01/2011 °ExitCare® Patient Information ©2014 [employer]. ° °Lumbosacral Strain °Lumbosacral strain is a strain of any of the parts that make up your lumbosacral  vertebrae. Your lumbosacral vertebrae are the bones that make up the lower third of your backbone. Your lumbosacral vertebrae are held together by muscles and tough, fibrous tissue (ligaments).  °CAUSES  °A sudden blow to your back can cause lumbosacral strain. Also, anything that causes an excessive stretch of the muscles in the low back can cause this strain. This is typically seen when people exert themselves strenuously, fall, lift heavy objects, bend, or crouch repeatedly. °RISK FACTORS °· Physically demanding work. °· Participation in pushing or pulling sports or sports that require sudden twist of the back (tennis, golf, baseball). °· Weight lifting. °· Excessive lower back curvature. °· Forward-tilted pelvis. °· Weak back or abdominal muscles or both. °· Tight hamstrings. °SIGNS AND SYMPTOMS  °Lumbosacral strain may cause pain in the area of your injury or pain that moves (radiates) down your leg.  °DIAGNOSIS °Your health care provider can often diagnose lumbosacral strain through a physical exam. In some cases, you may need tests such as X-ray exams.  °TREATMENT  °Treatment for your lower back injury depends on many factors that your clinician will have to evaluate. However, most treatment will include the use of anti-inflammatory medicines. °HOME CARE INSTRUCTIONS  °· Avoid hard physical activities (tennis, racquetball, waterskiing) if you are not in proper physical condition for it. This may aggravate or create problems. °· If you have a back problem, avoid sports requiring sudden body movements. Swimming and walking are generally safer activities. °· Maintain good posture. °· Maintain a healthy weight. °· For acute conditions, you may put ice on the injured area. °· Put ice in a plastic bag. °· Place a towel between your skin and the bag. °· Leave the ice on for 20 minutes, 2 3 times a day. °· When the low back starts healing, stretching and strengthening exercises may be recommended. °SEEK MEDICAL CARE  IF: °· Your back pain is getting worse. °· You experience severe back pain not relieved with medicines. °SEEK IMMEDIATE MEDICAL CARE IF:  °· You have numbness, tingling, weakness, or problems with the use of your arms or legs. °· There is a change in bowel or bladder control. °· You have increasing pain in any area of the body, including your belly (abdomen). °· You notice shortness of breath, dizziness, or feel faint. °· You feel sick to your stomach (nauseous), are throwing up (vomiting), or become sweaty. °· You notice discoloration of your toes or legs, or your feet get very cold. °MAKE SURE YOU:  °· Understand these instructions. °· Will watch your condition. °· Will get help right away if you are not doing well or get worse. °Document Released: 05/23/2005 Document Revised: 06/03/2013 Document Reviewed: 04/01/2013 °ExitCare® Patient Information ©2014 [employer]. ° °
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
## 2013-12-02 NOTE — ED Provider Notes (Signed)
Medical screening examination/treatment/procedure(s) were performed by non-physician practitioner and as supervising physician I was immediately available for consultation/collaboration.   EKG Interpretation None        Christopher [name] S Vitali Seibert, MD 12/02/13 2201 

## 2014-01-11 ENCOUNTER — Ambulatory Visit: Payer: Self-pay | Admitting: Internal Medicine

## 2014-10-22 ENCOUNTER — Emergency Department (HOSPITAL_COMMUNITY)
Admission: EM | Admit: 2014-10-22 | Discharge: 2014-10-22 | Disposition: A | Discharge status: Home / Self Care | Payer: Self-pay | Attending: Emergency Medicine | Admitting: Emergency Medicine

## 2014-10-22 ENCOUNTER — Encounter (HOSPITAL_COMMUNITY): Payer: Self-pay | Admitting: Emergency Medicine

## 2014-10-22 DIAGNOSIS — H748X1 Other specified disorders of right middle ear and mastoid: Secondary | ICD-10-CM | POA: Insufficient documentation

## 2014-10-22 DIAGNOSIS — H65191 Other acute nonsuppurative otitis media, right ear: Secondary | ICD-10-CM | POA: Insufficient documentation

## 2014-10-22 DIAGNOSIS — Z720 Tobacco use: Secondary | ICD-10-CM | POA: Insufficient documentation

## 2014-10-22 DIAGNOSIS — Z8709 Personal history of other diseases of the respiratory system: Secondary | ICD-10-CM | POA: Insufficient documentation

## 2014-10-22 MED ORDER — OFLOXACIN 0.3 % OT SOLN: 10.0000 [drp] | Freq: Two times a day (BID) | OTIC | Status: AC

## 2014-10-22 MED ORDER — AMOXICILLIN 500 MG PO CAPS: 500.0000 mg | ORAL_CAPSULE | Freq: Three times a day (TID) | ORAL | Status: AC

## 2014-10-22 NOTE — Discharge Instructions (Signed)

## 2014-10-22 NOTE — ED Provider Notes (Signed)
CSN: [id number]     Arrival date & time 10/22/14  1206 History  This chart was scribed for Terri Piedra, PA-C working with Joya Gaskins, MD by Elveria Rising, ED Scribe. This patient was seen in room TR09C/TR09C and the patient's care was started at 12:19 PM.   Chief Complaint  Patient presents with  . Otalgia   The history is provided by the patient. No language interpreter was used.   HPI Comments: Christopher [name] is a 29 y.o. male who presents to the Emergency Department complaining of inner right ear pain ongoing for 2.5 weeks. Patient reports treatment with generic OTC ear drops without relief. Patient denies congestion, rhinorrhea, ear drainage, sore throat, fever, chills, nausea, vomiting. 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   Past Medical History  Diagnosis Date  . Pneumothorax    Past Surgical History  Procedure Laterality Date  . Video assisted thoracoscopy (vats)/thorocotomy      For spontaneus Ptx   No family history on file. History  Substance Use Topics  . Smoking status: Current Every Day Smoker    Types: Cigarettes  . Smokeless tobacco: Not on file  . Alcohol Use: Yes    Review of Systems  Constitutional: Negative for fever and chills.  HENT: Positive for ear pain. Negative for congestion, ear discharge, rhinorrhea and sore throat.   Respiratory: Negative for cough.   Gastrointestinal: Negative for nausea and vomiting.   Allergies  Review of patient's allergies indicates no known allergies.  Home Medications   Prior to Admission medications   Medication Sig Start Date End Date Taking? Authorizing Provider  amoxicillin (AMOXIL) 500 MG capsule Take 1 capsule (500 mg total) by mouth 3 (three) times daily. 10/22/14   Violeta Lecount A Forcucci, PA-C  cyclobenzaprine (FLEXERIL) 10 MG tablet Take 1 tablet (10 mg total) by mouth 2 (two) times daily as needed  for muscle spasms. 12/02/13   Tiffany Irine Seal, PA-C  diphenhydramine-acetaminophen (TYLENOL PM) 25-500 MG TABS Take 2 tablets by mouth once.    Historical Provider, MD  ofloxacin (FLOXIN) 0.3 % otic solution Place 10 drops into the right ear 2 (two) times daily. 10/22/14   Aul Mangieri A Forcucci, PA-C  traMADol (ULTRAM) 50 MG tablet Take 1 tablet (50 mg total) by mouth every 6 (six) hours as needed. 12/02/13   Dorthula Matas, PA-C   Triage Vitals: BP 132/91 mmHg  Pulse 90  Temp(Src) 97.8 F (36.6 C) (Oral)  Resp 16  SpO2 100% Physical Exam  Constitutional: He is oriented to person, place, and time. He appears well-developed and well-nourished. No distress.  HENT:  Head: Normocephalic and atraumatic.  Right Ear: There is tenderness. No drainage or swelling. Tympanic membrane is not injected. A middle ear effusion is present. No decreased hearing is noted.  Left Ear: Tympanic membrane and ear canal normal.  Nose: Mucosal edema present.  Mouth/Throat: Oropharynx is clear and moist. No oropharyngeal exudate.  Brown fluid with bubbles behind the right eardrum with loss of landmarks. There is irritation erythema of the skin of the ear canal without active drainage.  Eyes: EOM are normal.  Neck: Neck supple. No JVD present. No thyromegaly present.  Cardiovascular: Normal rate, regular rhythm, normal heart sounds and intact distal pulses.  Exam reveals no gallop and no friction rub.   No murmur heard. Pulmonary/Chest: Effort normal and breath sounds normal. No respiratory  distress. He has no wheezes. He has no rales. He exhibits no tenderness.  Musculoskeletal: Normal range of motion.  Lymphadenopathy:    He has no cervical adenopathy.  Neurological: He is alert and oriented to person, place, and time.  Skin: Skin is warm and dry.  Psychiatric: He has a normal mood and affect. His behavior is normal.  Nursing note and vitals reviewed.   ED Course  Procedures (including critical care  time)  COORDINATION OF CARE: 12:22 PM- Will prescribe antibiotics and medicated ear drops. Patient given his return precautions. Discussed treatment plan with patient at bedside and patient agreed to plan.   Labs Review Labs Reviewed - No data to display  Imaging Review No results found.   EKG Interpretation None      MDM   Final diagnoses:  Acute nonsuppurative otitis media of right ear   Patient is a 29 year old male who presents emergency room for evaluation of right ear pain 2 and half weeks. On physical exam patient has middle ear effusion with brown bubbly fluid behind the right TM and erythema and swelling of the ear canal. Suspect that this is likely otitis media. I believe that patient has irritated his ear with over-the-counter drops so we will send home with ofloxacin eardrops and amoxicillin. Patient return for signs of mastoiditis which we have discussed, and any other concerning symptoms. Patient states understanding and agreement at this time. Patient follow-up with his PCP.  I personally performed the services described in this documentation, which was scribed in my presence. The recorded information has been reviewed and is accurate.    Eben Burowourtney A Forcucci, PA-C 10/22/14 1230  Joya Gaskinsonald W Wickline, MD 10/23/14 [phone]

## 2014-10-22 NOTE — ED Notes (Signed)
C/o right ear pain x 2 weeks. No other complaints.

## 2015-01-29 ENCOUNTER — Emergency Department (HOSPITAL_COMMUNITY)
Admission: EM | Admit: 2015-01-29 | Discharge: 2015-01-29 | Disposition: A | Discharge status: Home / Self Care | Payer: No Typology Code available for payment source

## 2016-05-24 ENCOUNTER — Encounter (HOSPITAL_COMMUNITY): Payer: Self-pay | Admitting: Emergency Medicine

## 2016-05-24 DIAGNOSIS — F17210 Nicotine dependence, cigarettes, uncomplicated: Secondary | ICD-10-CM | POA: Insufficient documentation

## 2016-05-24 DIAGNOSIS — G43809 Other migraine, not intractable, without status migrainosus: Secondary | ICD-10-CM | POA: Insufficient documentation

## 2016-05-24 LAB — COMPREHENSIVE METABOLIC PANEL
ALBUMIN: 4.1 g/dL (ref 3.5–5.0)
ALT: 23 U/L (ref 17–63)
AST: 33 U/L (ref 15–41)
Alkaline Phosphatase: 52 U/L (ref 38–126)
Anion gap: 12 (ref 5–15)
BILIRUBIN TOTAL: 0.6 mg/dL (ref 0.3–1.2)
BUN: 10 mg/dL (ref 6–20)
CHLORIDE: 101 mmol/L (ref 101–111)
CO2: 22 mmol/L (ref 22–32)
CREATININE: 1.05 mg/dL (ref 0.61–1.24)
Calcium: 9 mg/dL (ref 8.9–10.3)
GFR calc Af Amer: >60 mL/min (ref >60)
GFR calc non Af Amer: >60 mL/min (ref >60)
GLUCOSE: 124 mg/dL — AB (ref 65–99)
POTASSIUM: 3.9 mmol/L (ref 3.5–5.1)
Sodium: 135 mmol/L (ref 135–145)
Total Protein: 7 g/dL (ref 6.5–8.1)

## 2016-05-24 LAB — CBC WITH DIFFERENTIAL/PLATELET
BASOS ABS: 0 10*3/uL (ref 0.0–0.1)
BASOS PCT: 1 %
Eosinophils Absolute: 0 10*3/uL (ref 0.0–0.7)
Eosinophils Relative: 0 %
HEMATOCRIT: 41.7 % (ref 39.0–52.0)
Hemoglobin: 14 g/dL (ref 13.0–17.0)
LYMPHS PCT: 19 %
Lymphs Abs: 0.8 10*3/uL (ref 0.7–4.0)
MCH: 30 pg (ref 26.0–34.0)
MCHC: 33.6 g/dL (ref 30.0–36.0)
MCV: 89.3 fL (ref 78.0–100.0)
MONO ABS: 0.3 10*3/uL (ref 0.1–1.0)
Monocytes Relative: 8 %
NEUTROS ABS: 3 10*3/uL (ref 1.7–7.7)
Neutrophils Relative %: 72 %
PLATELETS: 190 10*3/uL (ref 150–400)
RBC: 4.67 MIL/uL (ref 4.22–5.81)
RDW: 12.4 % (ref 11.5–15.5)
WBC: 4.2 10*3/uL (ref 4.0–10.5)

## 2016-05-24 NOTE — ED Triage Notes (Signed)
Pt. reports headache , photophobia , fatigue , mild nausea and insomnia onset this week . Denies fever or chills.

## 2016-05-25 ENCOUNTER — Emergency Department (HOSPITAL_COMMUNITY)
Admission: EM | Admit: 2016-05-25 | Discharge: 2016-05-25 | Disposition: A | Discharge status: Home / Self Care | Payer: Self-pay | Attending: Emergency Medicine | Admitting: Emergency Medicine

## 2016-05-25 DIAGNOSIS — G43809 Other migraine, not intractable, without status migrainosus: Secondary | ICD-10-CM

## 2016-05-25 MED ORDER — IPRATROPIUM BROMIDE 0.02 % IN SOLN
0.5000 mg | Freq: Once | RESPIRATORY_TRACT | Status: AC
  Administered 2016-05-25: 0.5 mg via RESPIRATORY_TRACT
  Filled 2016-05-25: qty 2.5

## 2016-05-25 MED ORDER — SODIUM CHLORIDE 0.9 % IV BOLUS (SEPSIS)
1000.0000 mL | Freq: Once | INTRAVENOUS | Status: AC
  Administered 2016-05-25: 1000 mL via INTRAVENOUS

## 2016-05-25 MED ORDER — ALBUTEROL SULFATE (2.5 MG/3ML) 0.083% IN NEBU
5.0000 mg | INHALATION_SOLUTION | Freq: Once | RESPIRATORY_TRACT | Status: AC
  Administered 2016-05-25: 5 mg via RESPIRATORY_TRACT
  Filled 2016-05-25: qty 6

## 2016-05-25 MED ORDER — METOCLOPRAMIDE HCL 5 MG/ML IJ SOLN
10.0000 mg | Freq: Once | INTRAMUSCULAR | Status: AC
  Administered 2016-05-25: 10 mg via INTRAVENOUS
  Filled 2016-05-25: qty 2

## 2016-05-25 MED ORDER — DIPHENHYDRAMINE HCL 50 MG/ML IJ SOLN
25.0000 mg | Freq: Once | INTRAMUSCULAR | Status: AC
  Administered 2016-05-25: 25 mg via INTRAVENOUS
  Filled 2016-05-25: qty 1

## 2016-05-25 MED ORDER — KETOROLAC TROMETHAMINE 30 MG/ML IJ SOLN
30.0000 mg | Freq: Once | INTRAMUSCULAR | Status: AC
  Administered 2016-05-25: 30 mg via INTRAVENOUS
  Filled 2016-05-25: qty 1

## 2016-05-25 NOTE — ED Provider Notes (Signed)
[facility]-EMERGENCY DEPT Provider Note   CSN: [id number] Arrival date & time: 05/24/16  2027     History   Chief Complaint Chief Complaint  Patient presents with  . Migraine    HPI Christopher [name] is a 30 y.o. male.  HPI   Patient to the ER with PMH of pneumothorax.  He comes to the ER complaining of migraine headache for 2 days, it is associated with photophobia, nausea and insomnia. Starts from top of forehead to eyes and started while he was watching TV. He describes the sensation as throbbing and aggravated by noise and light. Pain is constant, pt is wearing eye mask. Sitting in the dark helps, he has tried Tylenol with no relief. He says that he has never had this type of headache pain before. He does have a family history of migraines. Pain currently a 9/10,  No fevers, vomiting, diarrhea, neck pain, SOB,  No LE swelling, ear pain or sore throat.  Past Medical History:  Diagnosis Date  . Pneumothorax     There are no active problems to display for this patient.   Past Surgical History:  Procedure Laterality Date  . VIDEO ASSISTED THORACOSCOPY (VATS)/THOROCOTOMY     For spontaneus Ptx       Home Medications    Prior to Admission medications   Medication Sig Start Date End Date Taking? Authorizing Provider  amoxicillin (AMOXIL) 500 MG capsule Take 1 capsule (500 mg total) by mouth 3 (three) times daily. 10/22/14   Courtney Forcucci, PA-C  cyclobenzaprine (FLEXERIL) 10 MG tablet Take 1 tablet (10 mg total) by mouth 2 (two) times daily as needed for muscle spasms. 12/02/13   Maira Christon Neva SeatGreene, PA-C  diphenhydramine-acetaminophen (TYLENOL PM) 25-500 MG TABS Take 2 tablets by mouth once.    Historical Provider, MD  ofloxacin (FLOXIN) 0.3 % otic solution Place 10 drops into the right ear 2 (two) times daily. 10/22/14   Courtney Forcucci, PA-C  traMADol (ULTRAM) 50 MG tablet Take 1 tablet (50 mg total) by mouth every 6 (six) hours as needed. 12/02/13   Marlon Peliffany Jadan Rouillard, PA-C     Family History No family history on file.  Social History Social History  Substance Use Topics  . Smoking status: Current Every Day Smoker    Types: Cigarettes  . Smokeless tobacco: Not on file  . Alcohol use Yes     Allergies   Review of patient's allergies indicates no known allergies.   Review of Systems Review of Systems  Review of Systems All other systems negative except as documented in the HPI. All pertinent positives and negatives as reviewed in the HPI.  Physical Exam Updated Vital Signs BP 122/80   Pulse 61   Temp 98.6 F (37 C) (Oral)   Resp 18   SpO2 100%   Physical Exam  Constitutional: He appears well-developed and well-nourished. No distress.  HENT:  Head: Normocephalic and atraumatic.  Eyes: Pupils are equal, round, and reactive to light.  Neck: Normal range of motion. Neck supple.  Cardiovascular: Normal rate and regular rhythm.   Pulmonary/Chest: Effort normal.  Abdominal: Soft.  Neurological: He is alert.  Cranial nerves grossly intact on exam. Pt alert and oriented x 3 Upper and lower extremity strength is symmetrical and physiologic Normal muscular tone No facial droop Coordination intact, no limb ataxia,No pronator drift  Skin: Skin is warm and dry.  Nursing note and vitals reviewed.    ED Treatments / Results  Labs (all labs ordered are listed, but  only abnormal results are displayed) Labs Reviewed  COMPREHENSIVE METABOLIC PANEL - Abnormal; Notable for the following:       Result Value   Glucose, Bld 124 (*)    All other components within normal limits  CBC WITH DIFFERENTIAL/PLATELET    EKG  EKG Interpretation None       Radiology No results found.  Procedures Procedures (including critical care time)  Medications Ordered in ED Medications  sodium chloride 0.9 % bolus 1,000 mL (1,000 mLs Intravenous New Bag/Given 05/25/16 0143)  diphenhydrAMINE (BENADRYL) injection 25 mg (25 mg Intravenous Given 05/25/16 0148)   metoCLOPramide (REGLAN) injection 10 mg (10 mg Intravenous Given 05/25/16 0147)  ketorolac (TORADOL) 30 MG/ML injection 30 mg (30 mg Intravenous Given 05/25/16 0144)  albuterol (PROVENTIL) (2.5 MG/3ML) 0.083% nebulizer solution 5 mg (5 mg Nebulization Given 05/25/16 0148)  ipratropium (ATROVENT) nebulizer solution 0.5 mg (0.5 mg Nebulization Given 05/25/16 0148)     Initial Impression / Assessment and Plan / ED Course  I have reviewed the triage vital signs and the nursing notes.  Pertinent labs & imaging results that were available during my care of the patient were reviewed by me and considered in my medical decision making (see chart for details).  Clinical Course   Pt HA treated and improved while in ED, now 0/10.  Presentation is like pts typical HA and non concerning for [facility], ICH, Meningitis, or temporal arteritis. Pt is afebrile with no focal neuro deficits, nuchal rigidity, or change in vision. Pt is to follow up with PCP to discuss prophylactic medication. Pt verbalizes understanding and is agreeable with plan to dc.    Final Clinical Impressions(s) / ED Diagnoses   Final diagnoses:  Other migraine without status migrainosus, not intractable    New Prescriptions New Prescriptions   No medications on file     Marlon Pel, PA-C 05/25/16 0256    Gwyneth Sprout, MD 05/28/16 2019

## 2016-05-25 NOTE — ED Notes (Signed)
Patient Alert and oriented X4. Stable and ambulatory. Patient verbalized understanding of the discharge instructions.  Patient belongings were taken by the patient.  

## 2017-02-27 ENCOUNTER — Emergency Department (HOSPITAL_COMMUNITY): Payer: Self-pay

## 2017-02-27 ENCOUNTER — Emergency Department (HOSPITAL_COMMUNITY)
Admission: EM | Admit: 2017-02-27 | Discharge: 2017-02-27 | Disposition: A | Discharge status: Home / Self Care | Payer: Self-pay | Attending: Emergency Medicine | Admitting: Emergency Medicine

## 2017-02-27 ENCOUNTER — Encounter (HOSPITAL_COMMUNITY): Payer: Self-pay

## 2017-02-27 DIAGNOSIS — S40012A Contusion of left shoulder, initial encounter: Secondary | ICD-10-CM | POA: Insufficient documentation

## 2017-02-27 DIAGNOSIS — Y999 Unspecified external cause status: Secondary | ICD-10-CM | POA: Insufficient documentation

## 2017-02-27 DIAGNOSIS — Y9289 Other specified places as the place of occurrence of the external cause: Secondary | ICD-10-CM | POA: Insufficient documentation

## 2017-02-27 DIAGNOSIS — S5002XA Contusion of left elbow, initial encounter: Secondary | ICD-10-CM | POA: Insufficient documentation

## 2017-02-27 DIAGNOSIS — W11XXXA Fall on and from ladder, initial encounter: Secondary | ICD-10-CM | POA: Insufficient documentation

## 2017-02-27 DIAGNOSIS — F17210 Nicotine dependence, cigarettes, uncomplicated: Secondary | ICD-10-CM | POA: Insufficient documentation

## 2017-02-27 DIAGNOSIS — Y93H9 Activity, other involving exterior property and land maintenance, building and construction: Secondary | ICD-10-CM | POA: Insufficient documentation

## 2017-02-27 DIAGNOSIS — W19XXXA Unspecified fall, initial encounter: Secondary | ICD-10-CM

## 2017-02-27 MED ORDER — TRAMADOL HCL 50 MG PO TABS: 50.0000 mg | ORAL_TABLET | Freq: Four times a day (QID) | ORAL | 0 refills | Status: AC | PRN

## 2017-02-27 NOTE — ED Triage Notes (Addendum)
Pt reports he was on a ladder painting and fell from the ladder onto his left shoulder about 30 mins prior to arrival. He states he was the third step from the very top of the ladder. He estimated he fell about 12 feet. He denies LOC, no head injury.

## 2017-02-27 NOTE — Discharge Instructions (Signed)
Wear arm sling as applied for the next several days for comfort, then slowly reintroduce activity as tolerated.  Ice for 20 minutes every 2 hours while awake for the next 2 days.  Tramadol as prescribed as needed for pain.  Follow-up with your primary Dr. if you're not improving in the next week.

## 2017-02-27 NOTE — ED Provider Notes (Signed)
[facility]-EMERGENCY DEPT Provider Note   CSN: [id number] Arrival date & time: 02/27/17  0007     History   Chief Complaint Chief Complaint  Patient presents with  . Fall  . Shoulder Injury    HPI Christopher [name] is a 31 y.o. male.  Patient is a 31 year old male with no significant past medical history. He presents today for evaluation of a fall. He reports being up on a ladder painting a house when he fell and landed on his left shoulder. Complaining of pain in his left shoulder and elbow. He denies any numbness or tingling. He denies any other injury in the fall. His pain is worse with movement and palpation. There are no alleviating factors.      Past Medical History:  Diagnosis Date  . Pneumothorax     There are no active problems to display for this patient.   Past Surgical History:  Procedure Laterality Date  . VIDEO ASSISTED THORACOSCOPY (VATS)/THOROCOTOMY     For spontaneus Ptx       Home Medications    Prior to Admission medications   Medication Sig Start Date End Date Taking? Authorizing Provider  amoxicillin (AMOXIL) 500 MG capsule Take 1 capsule (500 mg total) by mouth 3 (three) times daily. 10/22/14   Forcucci, Courtney, PA-C  cyclobenzaprine (FLEXERIL) 10 MG tablet Take 1 tablet (10 mg total) by mouth 2 (two) times daily as needed for muscle spasms. 12/02/13   Neva Seat, Tiffany, PA-C  diphenhydramine-acetaminophen (TYLENOL PM) 25-500 MG TABS Take 2 tablets by mouth once.    [provider]  ofloxacin (FLOXIN) 0.3 % otic solution Place 10 drops into the right ear 2 (two) times daily. 10/22/14   Forcucci, Courtney, PA-C  traMADol (ULTRAM) 50 MG tablet Take 1 tablet (50 mg total) by mouth every 6 (six) hours as needed. 12/02/13   Marlon Pel, PA-C    Family History No family history on file.  Social History Social History  Substance Use Topics  . Smoking status: Current Every Day Smoker    Types: Cigarettes  . Smokeless tobacco: Never Used  .  Alcohol use Yes     Allergies   Patient has no known allergies.   Review of Systems Review of Systems  All other systems reviewed and are negative.    Physical Exam Updated Vital Signs BP 121/78 (BP Location: Right Arm)   Pulse 78   Temp 98 F (36.7 C) (Oral)   Resp 18   SpO2 96%   Physical Exam  Constitutional: He is oriented to person, place, and time. He appears well-developed and well-nourished. No distress.  HENT:  Head: Normocephalic and atraumatic.  Mouth/Throat: Oropharynx is clear and moist.  Neck: Normal range of motion. Neck supple.  Cardiovascular: Normal rate and regular rhythm.  Exam reveals no friction rub.   No murmur heard. Pulmonary/Chest: Effort normal and breath sounds normal. No respiratory distress. He has no wheezes. He has no rales.  Abdominal: Soft. Bowel sounds are normal. He exhibits no distension. There is no tenderness.  Musculoskeletal: Normal range of motion. He exhibits no edema.  There is no obvious deformity or significant swelling of the left shoulder. He has tenderness to palpation over the lateral deltoid extending into the humerus and elbow. He has limited range of motion secondary to pain, however there is no crepitus. Ulnar and radial pulses are easily palpable. He is able to flex, extend, and oppose all fingers and sensation is intact throughout the entire hand.  Neurological:  He is alert and oriented to person, place, and time. Coordination normal.  Skin: Skin is warm and dry. He is not diaphoretic.  Nursing note and vitals reviewed.    ED Treatments / Results  Labs (all labs ordered are listed, but only abnormal results are displayed) Labs Reviewed - No data to display  EKG  EKG Interpretation None       Radiology Dg Shoulder Left  Result Date: 02/27/2017 CLINICAL DATA:  Left shoulder pain after falling from ladder EXAM: LEFT SHOULDER - 2+ VIEW COMPARISON:  None. FINDINGS: There is no evidence of fracture or  dislocation. There is no evidence of arthropathy or other focal bone abnormality. Soft tissues are unremarkable. IMPRESSION: No acute fracture or dislocation of the left shoulder. Electronically Signed   By: Deatra RobinsonKevin  Herman M.D.   On: 02/27/2017 01:23    Procedures Procedures (including critical care time)  Medications Ordered in ED Medications - No data to display   Initial Impression / Assessment and Plan / ED Course  I have reviewed the triage vital signs and the nursing notes.  Pertinent labs & imaging results that were available during my care of the patient were reviewed by me and considered in my medical decision making (see chart for details).  X-rays are negative for fracture. He will be placed in an arm sling, advised ice, rest, prescribed tramadol, and is to follow-up with primary Dr. if not improving in the next week.  Final Clinical Impressions(s) / ED Diagnoses   Final diagnoses:  None    New Prescriptions New Prescriptions   No medications on file     Geoffery Lyonselo, Lexia Vandevender, MD 02/27/17 [phone]

## 2017-02-27 NOTE — ED Notes (Signed)
Patient transported to X-ray 

## 2017-05-22 ENCOUNTER — Emergency Department (HOSPITAL_COMMUNITY)
Admission: EM | Admit: 2017-05-22 | Discharge: 2017-05-22 | Disposition: A | Discharge status: Home / Self Care | Payer: Self-pay | Attending: Emergency Medicine | Admitting: Emergency Medicine

## 2017-05-22 ENCOUNTER — Encounter (HOSPITAL_COMMUNITY): Payer: Self-pay | Admitting: Emergency Medicine

## 2017-05-22 DIAGNOSIS — W208XXA Other cause of strike by thrown, projected or falling object, initial encounter: Secondary | ICD-10-CM | POA: Insufficient documentation

## 2017-05-22 DIAGNOSIS — Y9389 Activity, other specified: Secondary | ICD-10-CM | POA: Insufficient documentation

## 2017-05-22 DIAGNOSIS — S0101XA Laceration without foreign body of scalp, initial encounter: Secondary | ICD-10-CM | POA: Insufficient documentation

## 2017-05-22 DIAGNOSIS — F17210 Nicotine dependence, cigarettes, uncomplicated: Secondary | ICD-10-CM | POA: Insufficient documentation

## 2017-05-22 DIAGNOSIS — Y9289 Other specified places as the place of occurrence of the external cause: Secondary | ICD-10-CM | POA: Insufficient documentation

## 2017-05-22 DIAGNOSIS — Y990 Civilian activity done for income or pay: Secondary | ICD-10-CM | POA: Insufficient documentation

## 2017-05-22 NOTE — Discharge Instructions (Signed)
Please read and follow all provided instructions.  Your diagnoses today include:  1. Laceration of scalp, initial encounter    Tests performed today include:  Vital signs. See below for your results today.   Medications prescribed:   None  Take any prescribed medications only as directed.   Home care instructions:  Follow any educational materials and wound care instructions contained in this packet.   Keep affected area above the level of your heart when possible to minimize swelling. Wash area gently twice a day with warm soapy water. Do not apply alcohol or hydrogen peroxide. Cover the area if it draining or weeping.   Return instructions:  Return to the Emergency Department if you have:  Fever  Worsening pain  Worsening swelling of the wound  Pus draining from the wound  Redness of the skin that moves away from the wound, especially if it streaks away from the affected area   Any other emergent concerns  Your vital signs today were: BP (!) 133/91    Pulse 79    Temp 98.3 F (36.8 C) (Oral)    Resp 18    SpO2 99%  If your blood pressure (BP) was elevated above 135/85 this visit, please have this repeated by your doctor within one month. --------------

## 2017-05-22 NOTE — ED Triage Notes (Signed)
Pt reports equipment fell frtom ceiling at work and struck posterior scalp. Pt denies LOC, n/v, reports dizziness.  Lac noted to scalp, new dressing applied in triage.

## 2017-05-22 NOTE — ED Provider Notes (Signed)
[facility]-EMERGENCY DEPT Provider Note   CSN: [id number] Arrival date & time: 05/22/17  1423     History   Chief Complaint Chief Complaint  Patient presents with  . Head Injury    HPI Christopher [name] is a 31 y.o. male.  Patient presents with complaint of scalp laceration. A piece of metal fell from the patient's work site striking him on the back of the head. He felt dizzy for a short period of time but did not lose consciousness. No vision changes, difficulty walking, weakness in arms or legs, vomiting. Patient cleaned the wound with peroxide prior to arrival. There is a dressing applied. Patient reports last tetanus several years ago, up-to-date.       Past Medical History:  Diagnosis Date  . Pneumothorax     There are no active problems to display for this patient.   Past Surgical History:  Procedure Laterality Date  . VIDEO ASSISTED THORACOSCOPY (VATS)/THOROCOTOMY     For spontaneus Ptx       Home Medications    Prior to Admission medications   Medication Sig Start Date End Date Taking? Authorizing Provider  amoxicillin (AMOXIL) 500 MG capsule Take 1 capsule (500 mg total) by mouth 3 (three) times daily. 10/22/14   Forcucci, Courtney, PA-C  cyclobenzaprine (FLEXERIL) 10 MG tablet Take 1 tablet (10 mg total) by mouth 2 (two) times daily as needed for muscle spasms. 12/02/13   Neva Seat, Tiffany, PA-C  diphenhydramine-acetaminophen (TYLENOL PM) 25-500 MG TABS Take 2 tablets by mouth once.    [provider]  ofloxacin (FLOXIN) 0.3 % otic solution Place 10 drops into the right ear 2 (two) times daily. 10/22/14   Forcucci, Courtney, PA-C  traMADol (ULTRAM) 50 MG tablet Take 1 tablet (50 mg total) by mouth every 6 (six) hours as needed. 02/27/17   Geoffery Lyons, MD    Family History No family history on file.  Social History Social History  Substance Use Topics  . Smoking status: Current Every Day Smoker    Packs/day: 0.50    Types: Cigarettes  . Smokeless  tobacco: Never Used  . Alcohol use Yes     Allergies   Patient has no known allergies.   Review of Systems Review of Systems  Constitutional: Negative for fatigue.  HENT: Negative for tinnitus.   Eyes: Negative for photophobia, pain and visual disturbance.  Respiratory: Negative for shortness of breath.   Cardiovascular: Negative for chest pain.  Gastrointestinal: Negative for nausea and vomiting.  Musculoskeletal: Negative for back pain, gait problem and neck pain.  Skin: Positive for wound.  Neurological: Positive for dizziness (transient, now resolved). Negative for weakness, light-headedness, numbness and headaches.  Psychiatric/Behavioral: Negative for confusion and decreased concentration.     Physical Exam Updated Vital Signs BP (!) 133/91   Pulse 79   Temp 98.3 F (36.8 C) (Oral)   Resp 18   SpO2 99%   Physical Exam  Constitutional: He is oriented to person, place, and time. He appears well-developed and well-nourished.  HENT:  Head: Normocephalic. Head is without raccoon's eyes and without Battle's sign.  Right Ear: Tympanic membrane, external ear and ear canal normal. No hemotympanum.  Left Ear: Tympanic membrane, external ear and ear canal normal. No hemotympanum.  Nose: Nose normal. No nasal septal hematoma.  Mouth/Throat: Oropharynx is clear and moist.  Patient with 3 cm, minimally gaping, hemostatic, clean laceration to the posterior scalp. Wound is L-shaped.  Eyes: Pupils are equal, round, and reactive to light. Conjunctivae,  EOM and lids are normal.  No visible hyphema  Neck: Normal range of motion. Neck supple.  Cardiovascular: Normal rate and regular rhythm.   Pulmonary/Chest: Effort normal and breath sounds normal.  Abdominal: Soft. There is no tenderness.  Musculoskeletal: Normal range of motion.       Cervical back: He exhibits normal range of motion, no tenderness and no bony tenderness.       Thoracic back: He exhibits no tenderness and no bony  tenderness.       Lumbar back: He exhibits no tenderness and no bony tenderness.  Neurological: He is alert and oriented to person, place, and time. He has normal strength and normal reflexes. No cranial nerve deficit or sensory deficit. Coordination normal. GCS eye subscore is 4. GCS verbal subscore is 5. GCS motor subscore is 6.  Skin: Skin is warm and dry.  Psychiatric: He has a normal mood and affect.  Nursing note and vitals reviewed.    ED Treatments / Results   Procedures Procedures (including critical care time)  Medications Ordered in ED Medications - No data to display   Initial Impression / Assessment and Plan / ED Course  I have reviewed the triage vital signs and the nursing notes.  Pertinent labs & imaging results that were available during my care of the patient were reviewed by me and considered in my medical decision making (see chart for details).     Patient seen and examined.   Vital signs reviewed and are as follows: BP (!) 133/91   Pulse 79   Temp 98.3 F (36.8 C) (Oral)   Resp 18   SpO2 99%   Discussed procedure for wound closure involving injection of lidocaine and staples. Patient requests that we cleaned the wound, however I discussed that this would not be possible given that it is on his scalp and hair would not allow for proper wound closure. Patient is deathly scared of needles. 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  We discussed wound care and signs and symptoms which should cause them to return to the hospital including worsening redness, pain, swelling, purulent drainage.  Patient was counseled on head injury precautions and symptoms that should indicate their return to the ED.  These include severe worsening headache, vision changes, confusion, loss of  consciousness, trouble walking, nausea & vomiting, or weakness/tingling in extremities.     Final Clinical Impressions(s) / ED Diagnoses   Final diagnoses:  Laceration of scalp, initial encounter   Patient with minor head injury and scalp laceration. Ideally this wound would be closed with sutures or staples, however patient declines after discussion as above. I have low suspicion for closed head injury or neck injury. No indication for head CT given [place] head CT criteria. Wound redressed and patient sent home with supplies. Area is clean, well vascularized, do not feel antibiotics are indicated. Tetanus is reportedly up-to-date.  New Prescriptions Discharge Medication List as of 05/22/2017  7:40 PM       Renne Crigler, PA-C 05/22/17 2034    Alvira Monday, MD 05/23/17 [phone]

## 2017-08-01 ENCOUNTER — Encounter (HOSPITAL_COMMUNITY): Payer: Self-pay

## 2017-08-01 ENCOUNTER — Other Ambulatory Visit: Payer: Self-pay

## 2017-08-01 ENCOUNTER — Emergency Department (HOSPITAL_COMMUNITY)
Admission: EM | Admit: 2017-08-01 | Discharge: 2017-08-01 | Disposition: A | Discharge status: Home / Self Care | Payer: No Typology Code available for payment source | Attending: Emergency Medicine | Admitting: Emergency Medicine

## 2017-08-01 DIAGNOSIS — Y999 Unspecified external cause status: Secondary | ICD-10-CM | POA: Insufficient documentation

## 2017-08-01 DIAGNOSIS — Y92410 Unspecified street and highway as the place of occurrence of the external cause: Secondary | ICD-10-CM | POA: Insufficient documentation

## 2017-08-01 DIAGNOSIS — M62838 Other muscle spasm: Secondary | ICD-10-CM | POA: Diagnosis not present

## 2017-08-01 DIAGNOSIS — Z79899 Other long term (current) drug therapy: Secondary | ICD-10-CM | POA: Diagnosis not present

## 2017-08-01 DIAGNOSIS — M545 Low back pain: Secondary | ICD-10-CM | POA: Diagnosis present

## 2017-08-01 DIAGNOSIS — F17210 Nicotine dependence, cigarettes, uncomplicated: Secondary | ICD-10-CM | POA: Diagnosis not present

## 2017-08-01 DIAGNOSIS — Y9389 Activity, other specified: Secondary | ICD-10-CM | POA: Insufficient documentation

## 2017-08-01 DIAGNOSIS — M62830 Muscle spasm of back: Secondary | ICD-10-CM | POA: Diagnosis not present

## 2017-08-01 MED ORDER — CYCLOBENZAPRINE HCL 10 MG PO TABS
10.0000 mg | ORAL_TABLET | Freq: Once | ORAL | Status: AC
  Administered 2017-08-01: 10 mg via ORAL
  Filled 2017-08-01: qty 1

## 2017-08-01 MED ORDER — NAPROXEN 375 MG PO TABS: 375.0000 mg | ORAL_TABLET | Freq: Two times a day (BID) | ORAL | 0 refills | Status: DC

## 2017-08-01 MED ORDER — IBUPROFEN 200 MG PO TABS
600.0000 mg | ORAL_TABLET | Freq: Once | ORAL | Status: AC
  Administered 2017-08-01: 600 mg via ORAL
  Filled 2017-08-01: qty 3

## 2017-08-01 MED ORDER — CYCLOBENZAPRINE HCL 10 MG PO TABS: 10.0000 mg | ORAL_TABLET | Freq: Two times a day (BID) | ORAL | 0 refills | Status: AC | PRN
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
## 2017-09-02 ENCOUNTER — Other Ambulatory Visit: Payer: Self-pay

## 2017-09-02 ENCOUNTER — Encounter (HOSPITAL_COMMUNITY): Payer: Self-pay

## 2017-09-02 ENCOUNTER — Emergency Department (HOSPITAL_COMMUNITY)
Admission: EM | Admit: 2017-09-02 | Discharge: 2017-09-02 | Disposition: A | Discharge status: Home / Self Care | Payer: No Typology Code available for payment source | Attending: Emergency Medicine | Admitting: Emergency Medicine

## 2017-09-02 DIAGNOSIS — R197 Diarrhea, unspecified: Secondary | ICD-10-CM | POA: Insufficient documentation

## 2017-09-02 DIAGNOSIS — Z5321 Procedure and treatment not carried out due to patient leaving prior to being seen by health care provider: Secondary | ICD-10-CM | POA: Insufficient documentation

## 2017-09-02 NOTE — ED Notes (Signed)
Pt did not respond when called. 

## 2017-09-02 NOTE — ED Triage Notes (Signed)
Patient c/o diarrhea since last night. Patient denies any abdominal pain or vomiting.

## 2021-11-09 ENCOUNTER — Emergency Department (HOSPITAL_COMMUNITY)
Admission: EM | Admit: 2021-11-09 | Discharge: 2021-11-09 | Disposition: A | Discharge status: Home / Self Care | Payer: Self-pay | Attending: Emergency Medicine | Admitting: Emergency Medicine

## 2021-11-09 ENCOUNTER — Emergency Department (HOSPITAL_COMMUNITY): Payer: Self-pay

## 2021-11-09 ENCOUNTER — Encounter (HOSPITAL_COMMUNITY): Payer: Self-pay

## 2021-11-09 DIAGNOSIS — S62141A Displaced fracture of body of hamate [unciform] bone, right wrist, initial encounter for closed fracture: Secondary | ICD-10-CM | POA: Insufficient documentation

## 2021-11-09 DIAGNOSIS — W208XXA Other cause of strike by thrown, projected or falling object, initial encounter: Secondary | ICD-10-CM | POA: Insufficient documentation

## 2021-11-09 DIAGNOSIS — M79641 Pain in right hand: Secondary | ICD-10-CM | POA: Insufficient documentation

## 2021-11-09 DIAGNOSIS — Y990 Civilian activity done for income or pay: Secondary | ICD-10-CM | POA: Insufficient documentation

## 2021-11-09 MED ORDER — OXYCODONE-ACETAMINOPHEN 5-325 MG PO TABS
1.0000 | ORAL_TABLET | Freq: Once | ORAL | Status: AC
  Administered 2021-11-09: 1 via ORAL
  Filled 2021-11-09: qty 1

## 2021-11-09 MED ORDER — NAPROXEN 500 MG PO TABS
500.0000 mg | ORAL_TABLET | Freq: Once | ORAL | Status: AC
  Administered 2021-11-09: 500 mg via ORAL
  Filled 2021-11-09: qty 1

## 2021-11-09 MED ORDER — HYDROCODONE-ACETAMINOPHEN 5-325 MG PO TABS: 1.0000 | ORAL_TABLET | ORAL | 0 refills | Status: AC | PRN

## 2021-11-09 MED ORDER — IBUPROFEN 800 MG PO TABS: 800.0000 mg | ORAL_TABLET | Freq: Three times a day (TID) | ORAL | 0 refills | Status: DC

## 2021-11-09 NOTE — ED Provider Notes (Signed)
?[facility]-EMERGENCY DEPT ?Provider Note ? ? ?CSN: [id number] ?Arrival date & time: 11/09/21  1153 ? ?  ? ?History ? ?Chief Complaint  ?Patient presents with  ? Hand Pain  ? ? ?Christopher [name] is a 36 y.o. male presenting with right hand pain.  Says that this has been going on since Tuesday.  On Tuesday he had a wooden dresser fall on the top of his hand.  Says that it has become increasingly painful and refractory to ibuprofen and Tylenol.  Says that he only tried these medications once or twice and threw them away because they are not working.  Denies any numbness or tingling.  Says that the swelling and pain is worse at night and that he has limited range of motion.  No history of injury to this hand. ? ? ?Home Medications ?Prior to Admission medications   ?Medication Sig Start Date End Date Taking? Authorizing Provider  ?amoxicillin (AMOXIL) 500 MG capsule Take 1 capsule (500 mg total) by mouth 3 (three) times daily. 10/22/14   Shirleen Schirmer, PA-C  ?cyclobenzaprine (FLEXERIL) 10 MG tablet Take 1 tablet (10 mg total) by mouth 2 (two) times daily as needed for muscle spasms. 08/01/17   Janne Napoleon, NP  ?diphenhydramine-acetaminophen (TYLENOL PM) 25-500 MG TABS Take 2 tablets by mouth once.    [provider]  ?naproxen (NAPROSYN) 375 MG tablet Take 1 tablet (375 mg total) by mouth 2 (two) times daily. 08/01/17   Janne Napoleon, NP  ?ofloxacin (FLOXIN) 0.3 % otic solution Place 10 drops into the right ear 2 (two) times daily. 10/22/14   Shirleen Schirmer, PA-C  ?traMADol (ULTRAM) 50 MG tablet Take 1 tablet (50 mg total) by mouth every 6 (six) hours as needed. 02/27/17   Geoffery Lyons, MD  ?   ? ?Allergies    ?Patient has no known allergies.   ? ?Review of Systems   ?Review of Systems ?See HPI ? ?Physical Exam ?Updated Vital Signs ?BP 118/79 (BP Location: Left Arm)   Pulse 91   Temp 98 ?F (36.7 ?C) (Oral)   Resp 16   SpO2 99%  ?Physical Exam ?Vitals and nursing note reviewed.   ?Constitutional:   ?   Appearance: Normal appearance.  ?HENT:  ?   Head: Normocephalic and atraumatic.  ?Eyes:  ?   General: No scleral icterus. ?   Conjunctiva/sclera: Conjunctivae normal.  ?Pulmonary:  ?   Effort: Pulmonary effort is normal. No respiratory distress.  ?Musculoskeletal:     ?   General: Swelling, tenderness and signs of injury present.  ?   Comments: Severe tenderness over the third through fifth metacarpals as well as PIPs.  No tenderness to the first 2 digits.  Moderate inflammation over the dorsal right hand.  No obvious lacerations.  Strong radial pulse. ? ?Flexion and extension and ulnar and radial deviation limited secondary to pain.  Passive motion intact. No tenderness over the wrist  ?Skin: ?   Capillary Refill: Capillary refill takes less than 2 seconds. Normal in all digits.   ?   Findings: No rash.  ?Neurological:  ?   Mental Status: He is alert.  ?Psychiatric:     ?   Mood and Affect: Mood normal.  ? ? ?ED Results / Procedures / Treatments   ?Labs ?(all labs ordered are listed, but only abnormal results are displayed) ?Labs Reviewed - No data to display ? ?EKG ?None ? ?Radiology ?DG Hand Complete Right ? ?Result Date: 11/09/2021 ?  CLINICAL DATA:  Right hand pain and swelling.  Injury yesterday. EXAM: RIGHT HAND - COMPLETE 3+ VIEW COMPARISON:  Right hand radiographs 09/29/2013 FINDINGS: There is an acute, displaced fracture of the dorsal body of the hamate with regional soft tissue swelling. No other definite acute fracture is identified. There is no dislocation. IMPRESSION: Displaced hamate fracture. Electronically Signed   By: Sebastian Ache M.D.   On: 11/09/2021 14:41   ? ?Procedures ?Procedures  ? ?Medications Ordered in ED ?Medications  ?naproxen (NAPROSYN) tablet 500 mg (500 mg Oral Given 11/09/21 1315)  ? ? ?ED Course/ Medical Decision Making/ A&P ?  ?                        ?Medical Decision Making ?Amount and/or Complexity of Data Reviewed ?Radiology: ordered. ? ?Risk ?Prescription  drug management. ? ? ?36 year old male presenting with right hand injury.  Denies any other concerns.  No lacerations.  Pain is refractory to intermittent ibuprofen to Tylenol use.  Reports symptoms are worse at night. ? ?Imaging: X-ray of the patient's hand was ordered and reviewed by me.  There are no signs of metacarpal fractures.  Radiologist reports a hamate fracture. ? ?MDM/disposition: I believe patient is stable for discharge home.  I will send him with Vicodin, ibuprofen as well as place a volar splint.  I spoke with Dr. Yehuda Budd with hand who plans to see the patient in clinic next week. ? ? ?Final Clinical Impression(s) / ED Diagnoses ?Final diagnoses:  ?Closed displaced fracture of body of hamate of right wrist, initial encounter  ? ? ?Rx / DC Orders ?ED Discharge Orders   ? ?      Ordered  ?  ibuprofen (ADVIL) 800 MG tablet  3 times daily       ? 11/09/21 1515  ?  HYDROcodone-acetaminophen (NORCO/VICODIN) 5-325 MG tablet  Every 4 hours PRN       ? 11/09/21 1519  ? ?  ?  ? ?  ? ?Results and diagnoses were explained to the patient. Return precautions discussed in full. Patient had no additional questions and expressed complete understanding. ? ? ?This chart was dictated using voice recognition software.  Despite best efforts to proofread,  errors can occur which can change the documentation meaning.  ?  ?Saddie Benders, PA-C ?11/09/21 1523 ? ?  ?Terald Sleeper, MD ?11/09/21 1711 ? ?

## 2021-11-09 NOTE — ED Notes (Addendum)
An After Visit Summary was printed and given to the patient. Discharge instructions given and no further questions at this time.  Pt states friend is driving him home.  

## 2021-11-09 NOTE — Discharge Instructions (Addendum)
Please call Dr. Yehuda Budd for an appointment sometime next week. ? ?You should not get your splint wet, if you shower with a should put it in a garbage bag.  It is important for you to take the ibuprofen around-the-clock to decrease the swelling.  This should also help some with pain.  Only take the hydrocodone medication for very severe pain, I am only sending you with 5 pills. ? ?Return with any numbness, tingling, loss of sensation or worsening symptoms prior to your appointment with hand surgery.  Read the information about your type of fracture attached to these papers. ?

## 2021-11-09 NOTE — ED Triage Notes (Signed)
Pt presents with c/o right hand pain. Pt reports that something fell on his hand at work yesterday. Pt has significant swelling to that right hand.  ?

## 2023-04-17 ENCOUNTER — Ambulatory Visit (HOSPITAL_COMMUNITY)
Admission: EM | Admit: 2023-04-17 | Discharge: 2023-04-17 | Disposition: A | Discharge status: Home / Self Care | Payer: Self-pay | Attending: Internal Medicine | Admitting: Internal Medicine

## 2023-04-17 ENCOUNTER — Encounter (HOSPITAL_COMMUNITY): Payer: Self-pay

## 2023-04-17 DIAGNOSIS — M792 Neuralgia and neuritis, unspecified: Secondary | ICD-10-CM

## 2023-04-17 DIAGNOSIS — X503XXA Overexertion from repetitive movements, initial encounter: Secondary | ICD-10-CM

## 2023-04-17 MED ORDER — NAPROXEN 375 MG PO TABS: 375.0000 mg | ORAL_TABLET | Freq: Two times a day (BID) | ORAL | 0 refills | Status: AC

## 2023-04-17 NOTE — ED Triage Notes (Signed)
Patient c/o pain left wrist and swelling. Patient states pain radiates up to the left shoulder and stops at the right shoulder. Patient denies any injuries.  Patient denies taking any medication for his left arm pain.

## 2023-04-17 NOTE — Discharge Instructions (Addendum)
I suspect your pain is due to overuse tendinitis. This is inflammation of the tendons in your left arm. Apply ice 20 minutes on 20 minutes off as needed for inflammation and pain. Apply heat to relax muscles as needed 20 minutes on 20 minutes off. Perform gentle range of motion exercises but mostly rest the left arm.  Take naproxen every 12 hours as needed for pain and inflammation.  Wear the wrist brace provided in the clinic today to help prevent pain and median nerve compression.  Follow-up with the orthopedic doctor listed on your paperwork for ongoing evaluation as needed.  If you develop any new or worsening symptoms or if your symptoms do not start to improve, please return here or follow-up with your primary care provider. If your symptoms are severe, please go to the emergency room.

## 2023-04-17 NOTE — ED Provider Notes (Signed)
[facility]    CSN: [id number] Arrival date & time: 04/17/23  1527      History   Chief Complaint Chief Complaint  Patient presents with  . Arm Pain    HPI Christopher [name] is a 37 y.o. male.   Patient presents to urgent care for evaluation of pain to the left hand .   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 Pneumothorax     There are no problems to display for this patient.   Past Surgical History:  Procedure Laterality Date  . VIDEO ASSISTED THORACOSCOPY (VATS)/THOROCOTOMY     For spontaneus Ptx       Home Medications    Prior to Admission medications   Medication Sig Start Date End Date Taking? Authorizing Provider  naproxen (NAPROSYN) 375 MG tablet Take 1 tablet (375 mg total) by mouth 2 (two) times daily. 04/17/23  Yes Carlisle Beers, FNP  amoxicillin (AMOXIL) 500 MG capsule Take 1 capsule (500 mg total) by mouth 3 (three) times daily. 10/22/14   Shirleen Schirmer, PA-C  cyclobenzaprine (FLEXERIL) 10 MG tablet Take 1 tablet (10 mg total) by mouth 2 (two) times daily as needed for muscle spasms. 08/01/17   Janne Napoleon, NP  diphenhydramine-acetaminophen (TYLENOL PM) 25-500 MG TABS Take 2 tablets by mouth once.    [provider]  HYDROcodone-acetaminophen (NORCO/VICODIN) 5-325 MG tablet Take 1 tablet by mouth every 4 (four) hours as needed. 11/09/21   Redwine, Madison A, PA-C  ofloxacin (FLOXIN) 0.3 % otic solution Place 10 drops into the right ear 2 (two) times daily. 10/22/14   Shirleen Schirmer, PA-C  traMADol (ULTRAM) 50 MG tablet Take 1 tablet (50 mg total) by mouth every 6 (six) hours as needed. 02/27/17    Geoffery Lyons, MD    Family History History reviewed. No pertinent family history.  Social History Social History   Tobacco Use  . Smoking status: Every Day    Current packs/day: 0.50    Types: Cigarettes  . Smokeless tobacco: Never  Vaping Use  . Vaping status: Never Used  Substance Use Topics  . Alcohol use: Yes  . Drug use: No     Allergies   Patient has no known allergies.   Review of Systems Review of Systems   Physical Exam Triage Vital Signs ED Triage Vitals  Encounter Vitals Group     BP 04/17/23 1555 109/72     Systolic BP Percentile --      Diastolic BP Percentile --      Pulse Rate 04/17/23 1555 93     Resp 04/17/23 1555 16     Temp 04/17/23 1555 98.5 F (36.9 C)     Temp Source 04/17/23 1555 Oral     SpO2 04/17/23 1555 94 %     Weight --      Height --      Head Circumference --      Peak Flow --      Pain Score 04/17/23 1558 9     Pain Loc --      Pain Education --      Exclude from Growth Chart --  No data found.  Updated Vital Signs BP 109/72 (BP Location: Right Arm)   Pulse 93   Temp 98.5 F (36.9 C) (Oral)   Resp 16   SpO2 94%   Visual Acuity Right Eye Distance:   Left Eye Distance:   Bilateral Distance:    Right Eye Near:   Left Eye Near:    Bilateral Near:     Physical Exam   UC Treatments / Results  Labs (all labs ordered are listed, but only abnormal results are displayed) Labs Reviewed - No data to display  EKG   Radiology No results found.  Procedures Procedures (including critical care time)  Medications Ordered in UC Medications - No data to display  Initial Impression / Assessment and Plan / UC Course  I have reviewed the triage vital signs and the nursing notes.  Pertinent labs & imaging results that were available during my care of the patient were reviewed by me and considered in my medical decision making (see chart for details).     *** Final Clinical Impressions(s) / UC Diagnoses    Final diagnoses:  Radicular pain in left arm   Discharge Instructions   None    ED Prescriptions     Medication Sig Dispense Auth. Provider   naproxen (NAPROSYN) 375 MG tablet Take 1 tablet (375 mg total) by mouth 2 (two) times daily. 20 tablet Carlisle Beers, FNP      PDMP not reviewed this encounter.

## 2024-03-16 ENCOUNTER — Emergency Department (HOSPITAL_COMMUNITY)
Admission: EM | Admit: 2024-03-16 | Discharge: 2024-03-17 | Disposition: A | Discharge status: Home / Self Care | Payer: Self-pay | Attending: Emergency Medicine | Admitting: Emergency Medicine

## 2024-03-16 ENCOUNTER — Emergency Department (HOSPITAL_COMMUNITY): Payer: Self-pay

## 2024-03-16 ENCOUNTER — Encounter (HOSPITAL_COMMUNITY): Payer: Self-pay | Admitting: Emergency Medicine

## 2024-03-16 DIAGNOSIS — T07XXXA Unspecified multiple injuries, initial encounter: Secondary | ICD-10-CM

## 2024-03-16 DIAGNOSIS — M25562 Pain in left knee: Secondary | ICD-10-CM

## 2024-03-16 DIAGNOSIS — S80212A Abrasion, left knee, initial encounter: Secondary | ICD-10-CM | POA: Insufficient documentation

## 2024-03-16 DIAGNOSIS — Y92410 Unspecified street and highway as the place of occurrence of the external cause: Secondary | ICD-10-CM | POA: Insufficient documentation
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
## 2024-03-17 MED ORDER — OXYCODONE-ACETAMINOPHEN 5-325 MG PO TABS
1.0000 | ORAL_TABLET | Freq: Once | ORAL | Status: AC
  Administered 2024-03-17: 1 via ORAL
  Filled 2024-03-17: qty 1

## 2024-03-17 NOTE — Discharge Instructions (Addendum)
 Continue wound care daily.  Can use topical neosporin or similar. Knee will likely be sore for a bit, gradually will improve.  Tylenol  or motrin  as needed -- can take up to 800mg  motrin  3x daily and up to 2000mg  tylenol  daily.  Can use concurrently. If after several days still having issues, please follow-up with orthopedics. Return here for any new/acute changes.
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
